# Patient Record
Sex: Male | Born: 1950 | Race: Black or African American | Hispanic: No | Smoking: Current some day smoker
Health system: Southern US, Community
[De-identification: ages and names within clinical notes are randomized; demographics above are authoritative.]

## PROBLEM LIST (undated history)

## (undated) DIAGNOSIS — I10 Essential (primary) hypertension: Secondary | ICD-10-CM

## (undated) DIAGNOSIS — E7800 Pure hypercholesterolemia, unspecified: Secondary | ICD-10-CM

---

## 2013-03-25 ENCOUNTER — Encounter (HOSPITAL_BASED_OUTPATIENT_CLINIC_OR_DEPARTMENT_OTHER): Payer: Self-pay | Admitting: *Deleted

## 2013-03-25 ENCOUNTER — Emergency Department (HOSPITAL_BASED_OUTPATIENT_CLINIC_OR_DEPARTMENT_OTHER)
Admission: EM | Admit: 2013-03-25 | Discharge: 2013-03-25 | Disposition: A | Discharge status: Home / Self Care | Payer: Non-veteran care | Attending: Emergency Medicine | Admitting: Emergency Medicine

## 2013-03-25 ENCOUNTER — Emergency Department (HOSPITAL_BASED_OUTPATIENT_CLINIC_OR_DEPARTMENT_OTHER): Payer: Non-veteran care

## 2013-03-25 DIAGNOSIS — R0789 Other chest pain: Secondary | ICD-10-CM

## 2013-03-25 DIAGNOSIS — Z862 Personal history of diseases of the blood and blood-forming organs and certain disorders involving the immune mechanism: Secondary | ICD-10-CM | POA: Insufficient documentation

## 2013-03-25 DIAGNOSIS — I10 Essential (primary) hypertension: Secondary | ICD-10-CM | POA: Insufficient documentation

## 2013-03-25 DIAGNOSIS — F17200 Nicotine dependence, unspecified, uncomplicated: Secondary | ICD-10-CM | POA: Insufficient documentation

## 2013-03-25 DIAGNOSIS — Z8639 Personal history of other endocrine, nutritional and metabolic disease: Secondary | ICD-10-CM | POA: Insufficient documentation

## 2013-03-25 DIAGNOSIS — R071 Chest pain on breathing: Secondary | ICD-10-CM | POA: Insufficient documentation

## 2013-03-25 HISTORY — DX: Essential (primary) hypertension: I10

## 2013-03-25 HISTORY — DX: Pure hypercholesterolemia, unspecified: E78.00

## 2013-03-25 LAB — CBC WITH DIFFERENTIAL/PLATELET
Hemoglobin: 17 g/dL (ref 13.0–17.0)
Lymphocytes Relative: 36 % (ref 12–46)
Lymphs Abs: 2.9 10*3/uL (ref 0.7–4.0)
MCH: 31 pg (ref 26.0–34.0)
Monocytes Relative: 7 % (ref 3–12)
Neutro Abs: 4.5 10*3/uL (ref 1.7–7.7)
Neutrophils Relative %: 55 % (ref 43–77)
RBC: 5.49 MIL/uL (ref 4.22–5.81)

## 2013-03-25 LAB — TROPONIN I: Troponin I: <0.3 ng/mL (ref <0.30)

## 2013-03-25 LAB — BASIC METABOLIC PANEL
BUN: 13 mg/dL (ref 6–23)
Chloride: 99 mEq/L (ref 96–112)
Glucose, Bld: 108 mg/dL — ABNORMAL HIGH (ref 70–99)
Potassium: 3.7 mEq/L (ref 3.5–5.1)

## 2013-03-25 MED ORDER — ASPIRIN 81 MG PO CHEW
324.0000 mg | CHEWABLE_TABLET | Freq: Once | ORAL | Status: AC
  Administered 2013-03-25: 324 mg via ORAL
  Filled 2013-03-25: qty 4

## 2013-03-25 MED ORDER — HYDROCODONE-ACETAMINOPHEN 5-325 MG PO TABS: 2.0000 | ORAL_TABLET | Freq: Once | ORAL | Status: DC

## 2013-03-25 MED ORDER — HYDROCODONE-ACETAMINOPHEN 5-325 MG PO TABS: 2.0000 | ORAL_TABLET | ORAL | Status: DC | PRN

## 2013-03-25 MED ORDER — IBUPROFEN 800 MG PO TABS: 800.0000 mg | ORAL_TABLET | Freq: Three times a day (TID) | ORAL | Status: DC

## 2013-03-25 NOTE — ED Notes (Signed)
Pt states he "feels much better"- EDPA Sofia updated- VS WNL

## 2013-03-25 NOTE — ED Provider Notes (Signed)
History    CSN: [id number] Arrival date & time 03/25/13  1445  First MD Initiated Contact with Patient 03/25/13 1518     Chief Complaint  Patient presents with  . Chest Pain   (Consider location/radiation/quality/duration/timing/severity/associated sxs/prior Treatment) Patient is a 62 y.o. male presenting with chest pain. The history is provided by the patient. No language interpreter was used.  Chest Pain Pain location:  L chest Pain quality: aching   Pain radiates to:  Does not radiate Pain radiates to the back: no   Pain severity:  Moderate Onset quality:  Unable to specify Timing:  Constant Progression:  Worsening Relieved by:  Nothing Worsened by:  Certain positions and movement Associated symptoms: no abdominal pain   Pt reports he awoke with pain in left side of chest.  Pt reports pain when he moves his arm.  Pt thinks he slept in a weird position and injured left arm and chest.   Past Medical History  Diagnosis Date  . Hypertension   . High cholesterol    History reviewed. No pertinent past surgical history. No family history on file. History  Substance Use Topics  . Smoking status: Current Every Day Smoker -- 0.50 packs/day    Types: Cigarettes  . Smokeless tobacco: Not on file  . Alcohol Use: No    Review of Systems  Cardiovascular: Positive for chest pain.  Gastrointestinal: Negative for abdominal pain.  All other systems reviewed and are negative.    Allergies  Review of patient's allergies indicates no known allergies.  Home Medications  No current outpatient prescriptions on file. BP 128/82  Pulse 88  Temp(Src) 98.7 F (37.1 C) (Oral)  Resp 20  Ht 5\' 11"  (1.803 m)  Wt 250 lb (113.399 kg)  BMI 34.88 kg/m2  SpO2 99% Physical Exam  Nursing note and vitals reviewed. Constitutional: He appears well-developed and well-nourished.  HENT:  Head: Normocephalic and atraumatic.  Eyes: Pupils are equal, round, and reactive to light.  Neck: Normal  range of motion.  Cardiovascular: Normal rate.   Pulmonary/Chest: Effort normal.  Abdominal: Soft.  Musculoskeletal: Normal range of motion.  Neurological: He is alert.  Skin: Skin is warm.    ED Course  Procedures (including critical care time) Labs Reviewed - No data to display No results found. 1. Chest wall pain    Results for orders placed during the hospital encounter of 03/25/13  TROPONIN I      Result Value Range   Troponin I <0.30  <0.30 ng/mL  CBC WITH DIFFERENTIAL      Result Value Range   WBC 8.1  4.0 - 10.5 K/uL   RBC 5.49  4.22 - 5.81 MIL/uL   Hemoglobin 17.0  13.0 - 17.0 g/dL   HCT 41.3  24.4 - 01.0 %   MCV 88.9  78.0 - 100.0 fL   MCH 31.0  26.0 - 34.0 pg   MCHC 34.8  30.0 - 36.0 g/dL   RDW 27.2  53.6 - 64.4 %   Platelets 181  150 - 400 K/uL   Neutrophils Relative % 55  43 - 77 %   Neutro Abs 4.5  1.7 - 7.7 K/uL   Lymphocytes Relative 36  12 - 46 %   Lymphs Abs 2.9  0.7 - 4.0 K/uL   Monocytes Relative 7  3 - 12 %   Monocytes Absolute 0.6  0.1 - 1.0 K/uL   Eosinophils Relative 2  0 - 5 %   Eosinophils Absolute  0.1  0.0 - 0.7 K/uL   Basophils Relative 0  0 - 1 %   Basophils Absolute 0.0  0.0 - 0.1 K/uL  BASIC METABOLIC PANEL      Result Value Range   Sodium 137  135 - 145 mEq/L   Potassium 3.7  3.5 - 5.1 mEq/L   Chloride 99  96 - 112 mEq/L   CO2 25  19 - 32 mEq/L   Glucose, Bld 108 (*) 70 - 99 mg/dL   BUN 13  6 - 23 mg/dL   Creatinine, Ser 4.09  0.50 - 1.35 mg/dL   Calcium 81.1  8.4 - 91.4 mg/dL   GFR calc non Af Amer >90  >90 mL/min   GFR calc Af Amer >90  >90 mL/min  TROPONIN I      Result Value Range   Troponin I <0.30  <0.30 ng/mL   No results found.  MDM   Date: 03/25/2013  Rate: 91  Rhythm: normal sinus rhythm  QRS Axis: left  Intervals: normal  ST/T Wave abnormalities: normal  Conduction Disutrbances:none  Narrative Interpretation:   Old EKG Reviewed: unchanged Monitor normal.   Troponin x2 negative,   Pt thinks discomfort is  muscular.   I advised pt to see his MD for recheck next week.  I doubt cardiac  Elson Areas, PA-C 03/25/13 2221

## 2013-03-25 NOTE — ED Notes (Signed)
Woke this am with left sided chest pain.

## 2013-03-25 NOTE — ED Notes (Signed)
rx x 2 given for ibuprofen and vicodin

## 2013-03-26 NOTE — ED Provider Notes (Signed)
History/physical exam/procedure(s) were performed by non-physician practitioner and as supervising physician I was immediately available for consultation/collaboration. I have reviewed all notes and am in agreement with care and plan.   Arch Methot S Taraann Olthoff, MD 03/26/13 2231 

## 2013-10-02 ENCOUNTER — Emergency Department (HOSPITAL_BASED_OUTPATIENT_CLINIC_OR_DEPARTMENT_OTHER): Payer: Medicare Other

## 2013-10-02 ENCOUNTER — Encounter (HOSPITAL_BASED_OUTPATIENT_CLINIC_OR_DEPARTMENT_OTHER): Payer: Self-pay | Admitting: Emergency Medicine

## 2013-10-02 ENCOUNTER — Emergency Department (HOSPITAL_BASED_OUTPATIENT_CLINIC_OR_DEPARTMENT_OTHER)
Admission: EM | Admit: 2013-10-02 | Discharge: 2013-10-02 | Disposition: A | Discharge status: Home / Self Care | Payer: Medicare Other | Attending: Emergency Medicine | Admitting: Emergency Medicine

## 2013-10-02 DIAGNOSIS — E7800 Pure hypercholesterolemia, unspecified: Secondary | ICD-10-CM | POA: Insufficient documentation

## 2013-10-02 DIAGNOSIS — I10 Essential (primary) hypertension: Secondary | ICD-10-CM | POA: Insufficient documentation

## 2013-10-02 DIAGNOSIS — J069 Acute upper respiratory infection, unspecified: Secondary | ICD-10-CM | POA: Insufficient documentation

## 2013-10-02 DIAGNOSIS — R079 Chest pain, unspecified: Secondary | ICD-10-CM

## 2013-10-02 DIAGNOSIS — F17200 Nicotine dependence, unspecified, uncomplicated: Secondary | ICD-10-CM | POA: Insufficient documentation

## 2013-10-02 LAB — CBC WITH DIFFERENTIAL/PLATELET
BASOS PCT: 0 % (ref 0–1)
Basophils Absolute: 0 10*3/uL (ref 0.0–0.1)
EOS ABS: 0.4 10*3/uL (ref 0.0–0.7)
EOS PCT: 5 % (ref 0–5)
HCT: 50.4 % (ref 39.0–52.0)
Hemoglobin: 17.1 g/dL — ABNORMAL HIGH (ref 13.0–17.0)
LYMPHS ABS: 3.1 10*3/uL (ref 0.7–4.0)
Lymphocytes Relative: 39 % (ref 12–46)
MCH: 30.5 pg (ref 26.0–34.0)
MCHC: 33.9 g/dL (ref 30.0–36.0)
MCV: 90 fL (ref 78.0–100.0)
Monocytes Absolute: 0.7 10*3/uL (ref 0.1–1.0)
Monocytes Relative: 9 % (ref 3–12)
Neutro Abs: 3.7 10*3/uL (ref 1.7–7.7)
Neutrophils Relative %: 46 % (ref 43–77)
PLATELETS: 177 10*3/uL (ref 150–400)
RBC: 5.6 MIL/uL (ref 4.22–5.81)
RDW: 14.6 % (ref 11.5–15.5)
WBC: 7.9 10*3/uL (ref 4.0–10.5)

## 2013-10-02 LAB — BASIC METABOLIC PANEL
BUN: 13 mg/dL (ref 6–23)
CALCIUM: 9.5 mg/dL (ref 8.4–10.5)
CO2: 24 mEq/L (ref 19–32)
Chloride: 99 mEq/L (ref 96–112)
Creatinine, Ser: 0.8 mg/dL (ref 0.50–1.35)
GLUCOSE: 126 mg/dL — AB (ref 70–99)
Potassium: 3.9 mEq/L (ref 3.7–5.3)
SODIUM: 139 meq/L (ref 137–147)

## 2013-10-02 LAB — TROPONIN I: Troponin I: <0.3 ng/mL (ref <0.30)

## 2013-10-02 LAB — D-DIMER, QUANTITATIVE: D-Dimer, Quant: <0.27 ug/mL-FEU (ref 0.00–0.48)

## 2013-10-02 MED ORDER — KETOROLAC TROMETHAMINE 30 MG/ML IJ SOLN: 30.0000 mg | Freq: Once | INTRAMUSCULAR | Status: DC

## 2013-10-02 MED ORDER — ASPIRIN 81 MG PO CHEW
324.0000 mg | CHEWABLE_TABLET | Freq: Once | ORAL | Status: AC
  Administered 2013-10-02: 324 mg via ORAL
  Filled 2013-10-02: qty 4

## 2013-10-02 MED ORDER — KETOROLAC TROMETHAMINE 30 MG/ML IJ SOLN
30.0000 mg | Freq: Once | INTRAMUSCULAR | Status: AC
  Administered 2013-10-02: 30 mg via INTRAVENOUS
  Filled 2013-10-02: qty 1

## 2013-10-02 NOTE — Discharge Instructions (Signed)
Chest Pain (Nonspecific) °It is often hard to give a specific diagnosis for the cause of chest pain. There is always a chance that your pain could be related to something serious, such as a heart attack or a blood clot in the lungs. You need to follow up with your caregiver for further evaluation. °CAUSES  °· Heartburn. °· Pneumonia or bronchitis. °· Anxiety or stress. °· Inflammation around your heart (pericarditis) or lung (pleuritis or pleurisy). °· A blood clot in the lung. °· A collapsed lung (pneumothorax). It can develop suddenly on its own (spontaneous pneumothorax) or from injury (trauma) to the chest. °· Shingles infection (herpes zoster virus). °The chest wall is composed of bones, muscles, and cartilage. Any of these can be the source of the pain. °· The bones can be bruised by injury. °· The muscles or cartilage can be strained by coughing or overwork. °· The cartilage can be affected by inflammation and become sore (costochondritis). °DIAGNOSIS  °Lab tests or other studies, such as X-rays, electrocardiography, stress testing, or cardiac imaging, may be needed to find the cause of your pain.  °TREATMENT  °· Treatment depends on what may be causing your chest pain. Treatment may include: °· Acid blockers for heartburn. °· Anti-inflammatory medicine. °· Pain medicine for inflammatory conditions. °· Antibiotics if an infection is present. °· You may be advised to change lifestyle habits. This includes stopping smoking and avoiding alcohol, caffeine, and chocolate. °· You may be advised to keep your head raised (elevated) when sleeping. This reduces the chance of acid going backward from your stomach into your esophagus. °· Most of the time, nonspecific chest pain will improve within 2 to 3 days with rest and mild pain medicine. °HOME CARE INSTRUCTIONS  °· If antibiotics were prescribed, take your antibiotics as directed. Finish them even if you start to feel better. °· For the next few days, avoid physical  activities that bring on chest pain. Continue physical activities as directed. °· Do not smoke. °· Avoid drinking alcohol. °· Only take over-the-counter or prescription medicine for pain, discomfort, or fever as directed by your caregiver. °· Follow your caregiver's suggestions for further testing if your chest pain does not go away. °· Keep any follow-up appointments you made. If you do not go to an appointment, you could develop lasting (chronic) problems with pain. If there is any problem keeping an appointment, you must call to reschedule. °SEEK MEDICAL CARE IF:  °· You think you are having problems from the medicine you are taking. Read your medicine instructions carefully. °· Your chest pain does not go away, even after treatment. °· You develop a rash with blisters on your chest. °SEEK IMMEDIATE MEDICAL CARE IF:  °· You have increased chest pain or pain that spreads to your arm, neck, jaw, back, or abdomen. °· You develop shortness of breath, an increasing cough, or you are coughing up blood. °· You have severe back or abdominal pain, feel nauseous, or vomit. °· You develop severe weakness, fainting, or chills. °· You have a fever. °THIS IS AN EMERGENCY. Do not wait to see if the pain will go away. Get medical help at once. Call your local emergency services (911 in [place] Do not drive yourself to the hospital. °MAKE SURE YOU:  °· Understand these instructions. °· Will watch your condition. °· Will get help right away if you are not doing well or get worse. °Document Released: 06/19/2005 Document Revised: 12/02/2011 Document Reviewed: 04/14/2008 °ExitCare® Patient Information ©2014 [employer],   LLC. Upper Respiratory Infection, Adult An upper respiratory infection (URI) is also known as the common cold. It is often caused by a type of germ (virus). Colds are easily spread (contagious). You can pass it to others by kissing, coughing, sneezing, or drinking out of the same glass. Usually, you get better in 1 or 2  weeks.  HOME CARE   Only take medicine as told by your doctor.  Use a warm mist humidifier or breathe in steam from a hot shower.  Drink enough water and fluids to keep your pee (urine) clear or pale yellow.  Get plenty of rest.  Return to work when your temperature is back to normal or as told by your doctor. You may use a face mask and wash your hands to stop your cold from spreading. GET HELP RIGHT AWAY IF:   After the first few days, you feel you are getting worse.  You have questions about your medicine.  You have chills, shortness of breath, or brown or red spit (mucus).  You have yellow or brown snot (nasal discharge) or pain in the face, especially when you bend forward.  You have a fever, puffy (swollen) neck, pain when you swallow, or white spots in the back of your throat.  You have a bad headache, ear pain, sinus pain, or chest pain.  You have a high-pitched whistling sound when you breathe in and out (wheezing).  You have a lasting cough or cough up blood.  You have sore muscles or a stiff neck. MAKE SURE YOU:   Understand these instructions.  Will watch your condition.  Will get help right away if you are not doing well or get worse. Document Released: 02/26/2008 Document Revised: 12/02/2011 Document Reviewed: 01/14/2011 [facility] Inc[facility] Patient Information 2014 [place]ExitCare, [place]LL[place].

## 2013-10-02 NOTE — ED Provider Notes (Signed)
CSN: [id number]     Arrival date & time 10/02/13  1457 History  This chart was scribed for Wayne Batonourtney F Dametri Ozburn, MD by Leone PayorSonum Patel, ED Scribe. This patient was seen in room MH07/MH07 and the patient's care was started 4:38 PM.    Chief Complaint  Patient presents with  . Chest Pain    The history is provided by the patient. No language interpreter was used.    HPI Comments: Wayne York is a 63 y.o. male with past medical history of HTN, high cholesterol who presents to the Emergency Department complaining of 2 days of constant chest pain that he describes as pressure. He reports having associated SOB which is worse with deep breathing. He denies similar symptoms in the past. Pt states he has been helping his wife who is recovering from surgery and thinks he may have pulled a muscle. He currently rates his pain as 7/10, 8/10 at max today. He is not taking any OTC medications to treat this pain. He denies leg swelling, fever, cough. He is a current smoker. His brother passed away of an MI at age 61-45.   Past Medical History  Diagnosis Date  . Hypertension   . High cholesterol    History reviewed. No pertinent past surgical history. No family history on file. History  Substance Use Topics  . Smoking status: Current Every Day Smoker -- 0.50 packs/day    Types: Cigarettes  . Smokeless tobacco: Not on file  . Alcohol Use: No    Review of Systems  Constitutional: Negative.  Negative for fever.  Respiratory: Positive for cough and chest tightness. Negative for shortness of breath.   Cardiovascular: Negative.  Negative for chest pain.  Gastrointestinal: Negative.  Negative for abdominal pain.  Genitourinary: Negative.  Negative for dysuria.  Musculoskeletal: Negative for back pain.  Skin: Negative for rash.  Neurological: Negative for headaches.  All other systems reviewed and are negative.    Allergies  Review of patient's allergies indicates no known allergies.  Home Medications   No current outpatient prescriptions on file. BP 102/71  Pulse 86  Temp(Src) 99.5 F (37.5 C) (Oral)  Resp 18  SpO2 95% Physical Exam  Nursing note and vitals reviewed. Constitutional: He is oriented to person, place, and time. He appears well-developed and well-nourished. No distress.  HENT:  Head: Normocephalic and atraumatic.  Mouth/Throat: Oropharynx is clear and moist.  Eyes: Pupils are equal, round, and reactive to light.  Neck: Neck supple.  Cardiovascular: Normal rate, regular rhythm and normal heart sounds.   No murmur heard. Pulmonary/Chest: Effort normal and breath sounds normal. No respiratory distress. He has no wheezes. He exhibits tenderness.  Abdominal: Soft. Bowel sounds are normal. There is no tenderness. There is no rebound.  Musculoskeletal: He exhibits no edema.  Lymphadenopathy:    He has no cervical adenopathy.  Neurological: He is alert and oriented to person, place, and time.  Skin: Skin is warm and dry.  Psychiatric: He has a normal mood and affect.    ED Course  Procedures (including critical care time)  DIAGNOSTIC STUDIES: Oxygen Saturation is 98% on RA, normal by my interpretation.    COORDINATION OF CARE: 4:38 PM Discussed treatment plan with pt at bedside and pt agreed to plan.   Labs Review Labs Reviewed  CBC WITH DIFFERENTIAL - Abnormal; Notable for the following:    Hemoglobin 17.1 (*)    All other components within normal limits  BASIC METABOLIC PANEL - Abnormal; Notable for the  following:    Glucose, Bld 126 (*)    All other components within normal limits  TROPONIN I  D-DIMER, QUANTITATIVE  TROPONIN I   Imaging Review Dg Chest 2 View  10/02/2013   CLINICAL DATA:  Left chest pain  EXAM: CHEST  2 VIEW  COMPARISON:  03/26/2003.  FINDINGS: Stable cardiomegaly without CHF or pneumonia. Tortuous ectatic thoracic aorta as before. Trachea is midline. No focal pneumonia. Negative for edema, effusion or pneumothorax.  IMPRESSION:  Cardiomegaly without acute process.  Tortuous ectatic aorta.   Electronically Signed   By: Ruel Favors M.D.   On: 10/02/2013 15:46    EKG Interpretation    Date/Time:  Saturday October 02 2013 15:12:14 EST Ventricular Rate:  88 PR Interval:  174 QRS Duration: 108 QT Interval:  346 QTC Calculation: 418 R Axis:   -87 Text Interpretation:  Normal sinus rhythm Left axis deviation Abnormal ECG No significant change since last tracing Confirmed by Bon Dowis  MD, Rasool Rommel ([id number]) on 10/02/2013 6:23:39 PM            MDM   1. Chest pain   2. URI (upper respiratory infection)    The patient presents with chest pain. Onset of symptoms was 2-3 days ago. It is worse with movement and coughing. Patient is nontoxic-appearing on exam and physical exam is benign. Risk factors for heart disease include hypertension and early family history as well as hypercholesterolemia. EKG is unchanged from prior and initial troponin is negative. Patient does have some reproducibility on pain and reports possible musculoskeletal aggravation. Patient was given aspirin and Toradol. He reports improvement of pain. Patient also reports URI symptoms including cough. Suspect patient's pain may be secondary to musculoskeletal etiology versus early URI. Delta troponin is negative. Patient does have risk factors for heart disease but can be followed up as an outpatient. He was given cardiology followup and encouraged to call for stress testing in the next 2-3 days.  After history, exam, and medical workup I feel the patient has been appropriately medically screened and is safe for discharge home. Pertinent diagnoses were discussed with the patient. Patient was given return precautions.   I personally performed the services described in this documentation, which was scribed in my presence. The recorded information has been reviewed and is accurate.    Wayne Baton, MD 10/02/13 [phone]

## 2013-10-02 NOTE — ED Notes (Signed)
Patient here with SSCP x 2 days, pain worse with inspiration and movement. Has been helping his wife who is recovering from surgery and thinks he may have pulled something

## 2014-02-07 ENCOUNTER — Emergency Department (HOSPITAL_BASED_OUTPATIENT_CLINIC_OR_DEPARTMENT_OTHER)
Admission: EM | Admit: 2014-02-07 | Discharge: 2014-02-07 | Disposition: A | Discharge status: Home / Self Care | Payer: Non-veteran care | Attending: Emergency Medicine | Admitting: Emergency Medicine

## 2014-02-07 ENCOUNTER — Encounter (HOSPITAL_BASED_OUTPATIENT_CLINIC_OR_DEPARTMENT_OTHER): Payer: Self-pay | Admitting: Emergency Medicine

## 2014-02-07 DIAGNOSIS — Z8639 Personal history of other endocrine, nutritional and metabolic disease: Secondary | ICD-10-CM | POA: Insufficient documentation

## 2014-02-07 DIAGNOSIS — Z862 Personal history of diseases of the blood and blood-forming organs and certain disorders involving the immune mechanism: Secondary | ICD-10-CM | POA: Insufficient documentation

## 2014-02-07 DIAGNOSIS — W57XXXA Bitten or stung by nonvenomous insect and other nonvenomous arthropods, initial encounter: Principal | ICD-10-CM

## 2014-02-07 DIAGNOSIS — T63301A Toxic effect of unspecified spider venom, accidental (unintentional), initial encounter: Secondary | ICD-10-CM

## 2014-02-07 DIAGNOSIS — S90569A Insect bite (nonvenomous), unspecified ankle, initial encounter: Secondary | ICD-10-CM | POA: Insufficient documentation

## 2014-02-07 DIAGNOSIS — Y929 Unspecified place or not applicable: Secondary | ICD-10-CM | POA: Insufficient documentation

## 2014-02-07 DIAGNOSIS — Y9389 Activity, other specified: Secondary | ICD-10-CM | POA: Insufficient documentation

## 2014-02-07 DIAGNOSIS — I10 Essential (primary) hypertension: Secondary | ICD-10-CM | POA: Insufficient documentation

## 2014-02-07 DIAGNOSIS — F17200 Nicotine dependence, unspecified, uncomplicated: Secondary | ICD-10-CM | POA: Insufficient documentation

## 2014-02-07 NOTE — Discharge Instructions (Signed)
Spider Bite Spider bites are not common. Most spider bites do not cause serious problems. The elderly, very young children, and people with certain existing medical conditions are more likely to experience significant symptoms. SYMPTOMS  Spider bites may not cause any pain at first. Within 1 or 2 days of the bite, there may be swelling, redness, and pain in the bite area. However, some spider bites can cause pain within the first hour. TREATMENT  Your caregiver may prescribe antibiotic medicine if a bacterial infection develops in the bite. However, not all spider bites require antibiotics or prescription medicines.  HOME CARE INSTRUCTIONS  Do not scratch the bite area.  Keep the bite area clean and dry. Wash the area with soap and water as directed.  Put ice or cool compresses on the bite area.  Put ice in a plastic bag.  Place a towel between your skin and the bag.  Leave the ice on for 20 minutes, 4 times a day for the first 2 to 3 days, or as directed.  Keep the bite area elevated above the level of your heart. This helps reduce redness and swelling.  Only take over-the-counter or prescription medicines as directed by your caregiver.  If you are given antibiotics, take them as directed. Finish them even if you start to feel better. You may need a tetanus shot if:  You cannot remember when you had your last tetanus shot.  You have never had a tetanus shot.  The injury broke your skin. If you get a tetanus shot, your arm may swell, get red, and feel warm to the touch. This is common and not a problem. If you need a tetanus shot and you choose not to have one, there is a rare chance of getting tetanus. Sickness from tetanus can be serious. SEEK MEDICAL CARE IF: Your bite is not better after 3 days of treatment. SEEK IMMEDIATE MEDICAL CARE IF:  Your bite turns purple or develops increased swelling, pain, or redness.  You develop shortness of breath or chest pain.  You have  muscle cramps or painful muscle spasms.  You develop abdominal pain, nausea, or vomiting.  You feel unusually tired or sleepy. MAKE SURE YOU:  Understand these instructions.  Will watch your condition.  Will get help right away if you are not doing well or get worse. Document Released: 10/17/2004 Document Revised: 12/02/2011 Document Reviewed: 04/10/2011 [employer] Patient Information 2014 [employer].  

## 2014-02-07 NOTE — ED Notes (Signed)
Pt felt something bite back of left calf on Saturday-reddened area with slight pain at presnt

## 2014-02-07 NOTE — ED Provider Notes (Signed)
CSN: [id number]     Arrival date & time 02/07/14  1237 History   First MD Initiated Contact with Patient 02/07/14 1247     No chief complaint on file.    (Consider location/radiation/quality/duration/timing/severity/associated sxs/prior Treatment) HPI Pt reports he was sitting on a chair on his porch 2 days ago when he felt something bite his L leg. Found several spiders under the chair, although he did not see what had actually bitten him. He has had a small red area there since then, mild itching, no pain or drainage. Minimal swelling.   Past Medical History  Diagnosis Date  . Hypertension   . High cholesterol    No past surgical history on file. No family history on file. History  Substance Use Topics  . Smoking status: Current Every Day Smoker -- 0.50 packs/day    Types: Cigarettes  . Smokeless tobacco: Not on file  . Alcohol Use: No    Review of Systems All other systems reviewed and are negative except as noted in HPI.     Allergies  Review of patient's allergies indicates no known allergies.  Home Medications   Prior to Admission medications   Not on File   BP 139/85  Pulse 67  Temp(Src) 99.8 F (37.7 C) (Oral)  Resp 16  Ht 5\' 10"  (1.778 m)  Wt 263 lb (119.296 kg)  BMI 37.74 kg/m2  SpO2 97% Physical Exam  Constitutional: He is oriented to person, place, and time. He appears well-developed and well-nourished.  HENT:  Head: Normocephalic and atraumatic.  Neck: Neck supple.  Pulmonary/Chest: Effort normal.  Neurological: He is alert and oriented to person, place, and time. No cranial nerve deficit.  Skin:  1cm x 1cm area of erythema, mild induration L medial calf, no fluctuance or drainage. No signs of surrounding cellulitis  Psychiatric: He has a normal mood and affect. His behavior is normal.    ED Course  Procedures (including critical care time) Labs Review Labs Reviewed - No data to display  Imaging Review No results found.   EKG  Interpretation None      MDM   Final diagnoses:  Spider bite   Likely insect vs spider bite, no signs of secondary infection or ulceration. Advised topical treatments and observation. Advised to return for any worsening pain, swelling, drainage, streaking or for any other concerns.     Delorus Langwell B. Bernette MayersSheldon, MD 02/07/14 1257

## 2014-09-19 ENCOUNTER — Emergency Department (HOSPITAL_BASED_OUTPATIENT_CLINIC_OR_DEPARTMENT_OTHER): Payer: Non-veteran care

## 2014-09-19 ENCOUNTER — Encounter (HOSPITAL_BASED_OUTPATIENT_CLINIC_OR_DEPARTMENT_OTHER): Payer: Self-pay

## 2014-09-19 ENCOUNTER — Emergency Department (HOSPITAL_BASED_OUTPATIENT_CLINIC_OR_DEPARTMENT_OTHER)
Admission: EM | Admit: 2014-09-19 | Discharge: 2014-09-19 | Disposition: A | Discharge status: Home / Self Care | Payer: Non-veteran care | Attending: Emergency Medicine | Admitting: Emergency Medicine

## 2014-09-19 DIAGNOSIS — I10 Essential (primary) hypertension: Secondary | ICD-10-CM | POA: Diagnosis not present

## 2014-09-19 DIAGNOSIS — Z8639 Personal history of other endocrine, nutritional and metabolic disease: Secondary | ICD-10-CM | POA: Diagnosis not present

## 2014-09-19 DIAGNOSIS — M25562 Pain in left knee: Secondary | ICD-10-CM | POA: Insufficient documentation

## 2014-09-19 DIAGNOSIS — Z87891 Personal history of nicotine dependence: Secondary | ICD-10-CM | POA: Diagnosis not present

## 2014-09-19 DIAGNOSIS — R52 Pain, unspecified: Secondary | ICD-10-CM

## 2014-09-19 MED ORDER — HYDROCODONE-ACETAMINOPHEN 5-325 MG PO TABS: 1.0000 | ORAL_TABLET | ORAL | Status: DC | PRN

## 2014-09-19 NOTE — ED Notes (Signed)
Pt reports left knee pain x 7 days.  Reports pain with weight bearing.

## 2014-09-19 NOTE — Discharge Instructions (Signed)

## 2014-09-19 NOTE — ED Provider Notes (Signed)
CSN: [id number]     Arrival date & time 09/19/14  1327 History   First MD Initiated Contact with Patient 09/19/14 1544     Chief Complaint  Patient presents with  . Knee Pain     (Consider location/radiation/quality/duration/timing/severity/associated sxs/prior Treatment) HPI Comments: Pt c/o left knee pain times 7 days. Denies redness, swelling or warmth to the area. Denies previous injury. States that he has pain with ambulation. He states that he fell out of bed a couple of days ago but doesn't remember that being the cause of his pain. He has been wearing a brace  The history is provided by the patient. No language interpreter was used.    Past Medical History  Diagnosis Date  . Hypertension   . High cholesterol    History reviewed. No pertinent past surgical history. No family history on file. History  Substance Use Topics  . Smoking status: Former Smoker -- 0.50 packs/day    Types: Cigarettes    Quit date: 09/05/2014  . Smokeless tobacco: Not on file  . Alcohol Use: Yes    Review of Systems  All other systems reviewed and are negative.     Allergies  Review of patient's allergies indicates no known allergies.  Home Medications   Prior to Admission medications   Medication Sig Start Date End Date Taking? Authorizing Provider  HYDROcodone-acetaminophen (NORCO/VICODIN) 5-325 MG per tablet Take 1-2 tablets by mouth every 4 (four) hours as needed. 09/19/14   Teressa LowerVrinda Laren Whaling, NP  UNKNOWN TO PATIENT meds for BP, cholesterol    Historical Provider, MD   BP 113/76 mmHg  Pulse 94  Temp(Src) 98.5 F (36.9 C) (Oral)  Resp 18  Ht 5\' 10"  (1.778 m)  Wt 264 lb (119.75 kg)  BMI 37.88 kg/m2  SpO2 97% Physical Exam  Constitutional: He is oriented to person, place, and time.  Cardiovascular: Normal rate and regular rhythm.   Pulmonary/Chest: Effort normal and breath sounds normal.  Musculoskeletal: Normal range of motion.  Knee without swelling redness or warmth. Full  rom . Pulses intact  Neurological: He is alert and oriented to person, place, and time. Coordination normal.  Skin: Skin is warm and dry.  Psychiatric: He has a normal mood and affect.  Nursing note and vitals reviewed.   ED Course  Procedures (including critical care time) Labs Review Labs Reviewed - No data to display  Imaging Review Dg Knee 2 Views Left  09/19/2014   CLINICAL DATA:  Pain medially for 1 week  EXAM: LEFT KNEE - 1-2 VIEW  COMPARISON:  April 15, 2013  FINDINGS: Frontal and lateral views were obtained. No fracture, dislocation, or effusion. Joint spaces appear intact. No erosive change. There is a small amount of calcification in the popliteal artery.  IMPRESSION: No fracture or effusion. No appreciable arthropathy. Atherosclerotic change in popliteal artery.   Electronically Signed   By: Bretta BangWilliam  Woodruff M.D.   On: 09/19/2014 14:04     EKG Interpretation None      MDM   Final diagnoses:  Left knee pain    No acute finding on xray. Will treat with hydrocodone for pain. Pt given referral to Dr. Vivi Barrackhudnall   Jeannett Dekoning, NP 09/19/14 1600  Purvis SheffieldForrest Harrison, MD 09/20/14 1007

## 2014-11-24 ENCOUNTER — Emergency Department (HOSPITAL_BASED_OUTPATIENT_CLINIC_OR_DEPARTMENT_OTHER): Payer: Non-veteran care

## 2014-11-24 ENCOUNTER — Emergency Department (HOSPITAL_BASED_OUTPATIENT_CLINIC_OR_DEPARTMENT_OTHER)
Admission: EM | Admit: 2014-11-24 | Discharge: 2014-11-24 | Disposition: A | Discharge status: Home / Self Care | Payer: Non-veteran care | Attending: Emergency Medicine | Admitting: Emergency Medicine

## 2014-11-24 ENCOUNTER — Encounter (HOSPITAL_BASED_OUTPATIENT_CLINIC_OR_DEPARTMENT_OTHER): Payer: Self-pay

## 2014-11-24 DIAGNOSIS — Z8639 Personal history of other endocrine, nutritional and metabolic disease: Secondary | ICD-10-CM | POA: Insufficient documentation

## 2014-11-24 DIAGNOSIS — J069 Acute upper respiratory infection, unspecified: Secondary | ICD-10-CM | POA: Diagnosis not present

## 2014-11-24 DIAGNOSIS — I10 Essential (primary) hypertension: Secondary | ICD-10-CM | POA: Insufficient documentation

## 2014-11-24 DIAGNOSIS — R05 Cough: Secondary | ICD-10-CM | POA: Diagnosis present

## 2014-11-24 DIAGNOSIS — Z87891 Personal history of nicotine dependence: Secondary | ICD-10-CM | POA: Diagnosis not present

## 2014-11-24 DIAGNOSIS — R059 Cough, unspecified: Secondary | ICD-10-CM

## 2014-11-24 MED ORDER — GUAIFENESIN-CODEINE 100-10 MG/5ML PO SOLN: 5.0000 mL | Freq: Three times a day (TID) | ORAL | Status: DC | PRN

## 2014-11-24 NOTE — Discharge Instructions (Signed)
Cough, Adult ° A cough is a reflex that helps clear your throat and airways. It can help heal the body or may be a reaction to an irritated airway. A cough may only last 2 or 3 weeks (acute) or may last more than 8 weeks (chronic).  °CAUSES °Acute cough: °· Viral or bacterial infections. °Chronic cough: °· Infections. °· Allergies. °· Asthma. °· Post-nasal drip. °· Smoking. °· Heartburn or acid reflux. °· Some medicines. °· Chronic lung problems (COPD). °· Cancer. °SYMPTOMS  °· Cough. °· Fever. °· Chest pain. °· Increased breathing rate. °· High-pitched whistling sound when breathing (wheezing). °· Colored mucus that you cough up (sputum). °TREATMENT  °· A bacterial cough may be treated with antibiotic medicine. °· A viral cough must run its course and will not respond to antibiotics. °· Your caregiver may recommend other treatments if you have a chronic cough. °HOME CARE INSTRUCTIONS  °· Only take over-the-counter or prescription medicines for pain, discomfort, or fever as directed by your caregiver. Use cough suppressants only as directed by your caregiver. °· Use a cold steam vaporizer or humidifier in your bedroom or home to help loosen secretions. °· Sleep in a semi-upright position if your cough is worse at night. °· Rest as needed. °· Stop smoking if you smoke. °SEEK IMMEDIATE MEDICAL CARE IF:  °· You have pus in your sputum. °· Your cough starts to worsen. °· You cannot control your cough with suppressants and are losing sleep. °· You begin coughing up blood. °· You have difficulty breathing. °· You develop pain which is getting worse or is uncontrolled with medicine. °· You have a fever. °MAKE SURE YOU:  °· Understand these instructions. °· Will watch your condition. °· Will get help right away if you are not doing well or get worse. °Document Released: 03/08/2011 Document Revised: 12/02/2011 Document Reviewed: 03/08/2011 °Exit[employer]® Patient Information ©2015 [employer]. This information is not intended  to replace advice given to you by your health care provider. Make sure you discuss any questions you have with your health care provider. °Upper Respiratory Infection, Adult °An upper respiratory infection (URI) is also sometimes known as the common cold. The upper respiratory tract includes the nose, sinuses, throat, trachea, and bronchi. Bronchi are the airways leading to the lungs. Most people improve within 1 week, but symptoms can last up to 2 weeks. A residual cough may last even longer.  °CAUSES °Many different viruses can infect the tissues lining the upper respiratory tract. The tissues become irritated and inflamed and often become very moist. Mucus production is also common. A cold is contagious. You can easily spread the virus to others by oral contact. This includes kissing, sharing a glass, coughing, or sneezing. Touching your mouth or nose and then touching a surface, which is then touched by another person, can also spread the virus. °SYMPTOMS  °Symptoms typically develop 1 to 3 days after you come in contact with a cold virus. Symptoms vary from person to person. They may include: °· Runny nose. °· Sneezing. °· Nasal congestion. °· Sinus irritation. °· Sore throat. °· Loss of voice (laryngitis). °· Cough. °· Fatigue. °· Muscle aches. °· Loss of appetite. °· Headache. °· Low-grade fever. °DIAGNOSIS  °You might diagnose your own cold based on familiar symptoms, since most people get a cold 2 to 3 times a year. Your caregiver can confirm this based on your exam. Most importantly, your caregiver can check that your symptoms are not due to another disease such   as strep throat, sinusitis, pneumonia, asthma, or epiglottitis. Blood tests, throat tests, and X-rays are not necessary to diagnose a common cold, but they may sometimes be helpful in excluding other more serious diseases. Your caregiver will decide if any further tests are required. °RISKS AND COMPLICATIONS  °You may be at risk for a more severe  case of the common cold if you smoke cigarettes, have chronic heart disease (such as heart failure) or lung disease (such as asthma), or if you have a weakened immune system. The very young and very old are also at risk for more serious infections. Bacterial sinusitis, middle ear infections, and bacterial pneumonia can complicate the common cold. The common cold can worsen asthma and chronic obstructive pulmonary disease (COPD). Sometimes, these complications can require emergency medical care and may be life-threatening. °PREVENTION  °The best way to protect against getting a cold is to practice good hygiene. Avoid oral or hand contact with people with cold symptoms. Wash your hands often if contact occurs. There is no clear evidence that vitamin C, vitamin E, echinacea, or exercise reduces the chance of developing a cold. However, it is always recommended to get plenty of rest and practice good nutrition. °TREATMENT  °Treatment is directed at relieving symptoms. There is no cure. Antibiotics are not effective, because the infection is caused by a virus, not by bacteria. Treatment may include: °· Increased fluid intake. Sports drinks offer valuable electrolytes, sugars, and fluids. °· Breathing heated mist or steam (vaporizer or shower). °· Eating chicken soup or other clear broths, and maintaining good nutrition. °· Getting plenty of rest. °· Using gargles or lozenges for comfort. °· Controlling fevers with ibuprofen or acetaminophen as directed by your caregiver. °· Increasing usage of your inhaler if you have asthma. °Zinc gel and zinc lozenges, taken in the first 24 hours of the common cold, can shorten the duration and lessen the severity of symptoms. Pain medicines may help with fever, muscle aches, and throat pain. A variety of non-prescription medicines are available to treat congestion and runny nose. Your caregiver can make recommendations and may suggest nasal or lung inhalers for other symptoms.  °HOME  CARE INSTRUCTIONS  °· Only take over-the-counter or prescription medicines for pain, discomfort, or fever as directed by your caregiver. °· Use a warm mist humidifier or inhale steam from a shower to increase air moisture. This may keep secretions moist and make it easier to breathe. °· Drink enough water and fluids to keep your urine clear or pale yellow. °· Rest as needed. °· Return to work when your temperature has returned to normal or as your caregiver advises. You may need to stay home longer to avoid infecting others. You can also use a face mask and careful hand washing to prevent spread of the virus. °SEEK MEDICAL CARE IF:  °· After the first few days, you feel you are getting worse rather than better. °· You need your caregiver's advice about medicines to control symptoms. °· You develop chills, worsening shortness of breath, or brown or red sputum. These may be signs of pneumonia. °· You develop yellow or brown nasal discharge or pain in the face, especially when you bend forward. These may be signs of sinusitis. °· You develop a fever, swollen neck glands, pain with swallowing, or white areas in the back of your throat. These may be signs of strep throat. °SEEK IMMEDIATE MEDICAL CARE IF:  °· You have a fever. °· You develop severe or persistent headache, ear   pain, sinus pain, or chest pain. °· You develop wheezing, a prolonged cough, cough up blood, or have a change in your usual mucus (if you have chronic lung disease). °· You develop sore muscles or a stiff neck. °Document Released: 03/05/2001 Document Revised: 12/02/2011 Document Reviewed: 12/15/2013 °ExitCare® Patient Information ©2015 [employer]. This information is not intended to replace advice given to you by your health care provider. Make sure you discuss any questions you have with your health care provider. ° °

## 2014-11-24 NOTE — ED Notes (Signed)
Pt c/o productive cough with green sputum since Sunday.  Pt states cough "starts in my throat".

## 2014-11-24 NOTE — ED Provider Notes (Signed)
CSN: [id number]     Arrival date & time 11/24/14  1740 History  This chart was scribed for Audree CamelScott T Taytem Ghattas, MD by Luisa DagoPriscilla Tutu, ED Scribe. This patient was seen in room MH10/MH10 and the patient's care was started at 6:16 PM.    Chief Complaint  Patient presents with  . Cough   The history is provided by the patient and medical records. No language interpreter was used.   HPI Comments: Wayne CoombesWillie York is a 64 y.o. male with PMhx of Bronchitis and HTN presents to the Emergency Department complaining of gradual onset nonproductive cough that started 4 days ago but was exacerbated today. Pt states that he [place] in [place] to get an appointment but he advised him to come to the ED to be evaluated, since his did have an open slot for him. He states that the cough is worse at night and is causing him to feel SOB. Pt reports taking antibiotics at home that were filled from a previous illness and doing nebulizer treatments at home without adequate relief of his symptoms.  Pt denies fever,sore throat, rhinorrhea, or nasal congestion.  Denies current dyspnea.  Past Medical History  Diagnosis Date  . Hypertension   . High cholesterol    History reviewed. No pertinent past surgical history. No family history on file. History  Substance Use Topics  . Smoking status: Former Smoker -- 0.50 packs/day    Types: Cigarettes    Quit date: 09/05/2014  . Smokeless tobacco: Not on file  . Alcohol Use: Yes    Review of Systems  Constitutional: Negative for fever and chills.  HENT: Negative for congestion, rhinorrhea and sore throat.   Respiratory: Positive for cough and shortness of breath. Negative for wheezing.   Cardiovascular: Negative for chest pain.  All other systems reviewed and are negative.  Allergies  Review of patient's allergies indicates no known allergies.  Home Medications   Prior to Admission medications   Medication Sig Start Date End Date Taking? Authorizing Provider   HYDROcodone-acetaminophen (NORCO/VICODIN) 5-325 MG per tablet Take 1-2 tablets by mouth every 4 (four) hours as needed. 09/19/14   Teressa LowerVrinda Pickering, NP  UNKNOWN TO PATIENT meds for BP, cholesterol    Historical Provider, MD   BP 129/87 mmHg  Pulse 108  Temp(Src) 99 F (37.2 C) (Oral)  Resp 16  Ht 5\' 8"  (1.727 m)  SpO2 95%  Physical Exam  Constitutional: He is oriented to person, place, and time. He appears well-developed and well-nourished. No distress.  HENT:  Head: Normocephalic and atraumatic.  Right Ear: External ear normal.  Left Ear: External ear normal.  Nose: Nose normal.  Mouth/Throat: Oropharynx is clear and moist. No oropharyngeal exudate.  Eyes: Right eye exhibits no discharge. Left eye exhibits no discharge.  Neck: Neck supple.  Cardiovascular: Normal rate, regular rhythm and normal heart sounds.   Pulmonary/Chest: Effort normal and breath sounds normal. No respiratory distress. He has no wheezes.  Abdominal: He exhibits no distension. There is no tenderness.  Neurological: He is alert and oriented to person, place, and time.  Skin: Skin is warm and dry.  Psychiatric: He has a normal mood and affect. His behavior is normal.  Nursing note and vitals reviewed.   ED Course  Procedures (including critical care time)  DIAGNOSTIC STUDIES: Oxygen Saturation is 95% on RA, adequate by my interpretation.    COORDINATION OF CARE: 6:21 PM- Will order CXR. Pt advised of plan for treatment and pt agrees.  Labs Review Labs Reviewed - No data to display  Imaging Review Dg Chest 2 View  11/24/2014   CLINICAL DATA:  Cough, congestion for a few days. History of hypertension. Ex-smoker. Initial encounter.  EXAM: CHEST  2 VIEW  COMPARISON:  10/02/2013 and 03/25/2013 radiographs.  FINDINGS: The heart size and mediastinal contours are stable with aortic tortuosity. The lungs are clear. There is no pleural effusion or pneumothorax. The bones appear unchanged.  IMPRESSION: Stable  aortic tortuosity.  No acute cardiopulmonary process.   Electronically Signed   By: Carey Bullocks M.D.   On: 11/24/2014 18:58     EKG Interpretation None      MDM   Final diagnoses:  Upper respiratory infection    No signs of bacterial pneumonia. Well appearing, no hypoxia, bronchospasm or respiratory distress. Will treat symptomatically with cough syrup and recommend f/u with PCP.  I personally performed the services described in this documentation, which was scribed in my presence. The recorded information has been reviewed and is accurate.    Audree Camel, MD 11/25/14 [phone]

## 2014-11-24 NOTE — ED Notes (Signed)
Reports productive cough since Sunday. Subjective fever at home

## 2015-02-23 ENCOUNTER — Emergency Department (HOSPITAL_BASED_OUTPATIENT_CLINIC_OR_DEPARTMENT_OTHER)
Admission: EM | Admit: 2015-02-23 | Discharge: 2015-02-23 | Disposition: A | Discharge status: Home / Self Care | Payer: Medicare Other | Attending: Emergency Medicine | Admitting: Emergency Medicine

## 2015-02-23 ENCOUNTER — Encounter (HOSPITAL_BASED_OUTPATIENT_CLINIC_OR_DEPARTMENT_OTHER): Payer: Self-pay | Admitting: *Deleted

## 2015-02-23 DIAGNOSIS — Y998 Other external cause status: Secondary | ICD-10-CM | POA: Insufficient documentation

## 2015-02-23 DIAGNOSIS — Z720 Tobacco use: Secondary | ICD-10-CM | POA: Insufficient documentation

## 2015-02-23 DIAGNOSIS — Y9289 Other specified places as the place of occurrence of the external cause: Secondary | ICD-10-CM | POA: Insufficient documentation

## 2015-02-23 DIAGNOSIS — T465X5A Adverse effect of other antihypertensive drugs, initial encounter: Secondary | ICD-10-CM | POA: Insufficient documentation

## 2015-02-23 DIAGNOSIS — T783XXA Angioneurotic edema, initial encounter: Secondary | ICD-10-CM

## 2015-02-23 DIAGNOSIS — M25462 Effusion, left knee: Secondary | ICD-10-CM | POA: Insufficient documentation

## 2015-02-23 DIAGNOSIS — I10 Essential (primary) hypertension: Secondary | ICD-10-CM | POA: Insufficient documentation

## 2015-02-23 DIAGNOSIS — E780 Pure hypercholesterolemia: Secondary | ICD-10-CM | POA: Insufficient documentation

## 2015-02-23 DIAGNOSIS — X58XXXA Exposure to other specified factors, initial encounter: Secondary | ICD-10-CM | POA: Insufficient documentation

## 2015-02-23 DIAGNOSIS — R234 Changes in skin texture: Secondary | ICD-10-CM

## 2015-02-23 DIAGNOSIS — Z79899 Other long term (current) drug therapy: Secondary | ICD-10-CM | POA: Insufficient documentation

## 2015-02-23 DIAGNOSIS — Y9389 Activity, other specified: Secondary | ICD-10-CM | POA: Insufficient documentation

## 2015-02-23 MED ORDER — FAMOTIDINE 20 MG PO TABS: 20.0000 mg | ORAL_TABLET | Freq: Every day | ORAL | Status: DC

## 2015-02-23 MED ORDER — DIPHENHYDRAMINE HCL 25 MG PO CAPS
25.0000 mg | ORAL_CAPSULE | Freq: Once | ORAL | Status: AC
  Administered 2015-02-23: 25 mg via ORAL
  Filled 2015-02-23: qty 1

## 2015-02-23 MED ORDER — DIPHENHYDRAMINE HCL 25 MG PO CAPS: 25.0000 mg | ORAL_CAPSULE | Freq: Three times a day (TID) | ORAL | Status: DC | PRN

## 2015-02-23 MED ORDER — FAMOTIDINE 20 MG PO TABS
20.0000 mg | ORAL_TABLET | Freq: Once | ORAL | Status: AC
  Administered 2015-02-23: 20 mg via ORAL
  Filled 2015-02-23: qty 1

## 2015-02-23 NOTE — ED Notes (Signed)
Swelling to chin, rt bullock onset yesterday and left knee swelling onset Monday

## 2015-02-23 NOTE — ED Notes (Signed)
Swelling to chin and rt buttlock  onset yesterday  And left knee on monday

## 2015-02-23 NOTE — ED Provider Notes (Signed)
CSN: [id number]     Arrival date & time 02/23/15  0305 History   First MD Initiated Contact with Patient 02/23/15 0316     Chief Complaint  Patient presents with  . Facial Swelling     (Consider location/radiation/quality/duration/timing/severity/associated sxs/prior Treatment) HPI  This a 64 year old male who presents with swelling of the chin and left knee. Patient reports onset of symptoms 2 days ago. He noted swelling and itching of the left knee. He states that yesterday he noted a bump over his chin which is gotten progressively larger. He also states he is noted swelling and a possible boil over his right butt cheek. Patient denies any new medications, foods, or ingestions. He is unsure whether he got bitten by anything. No recent dental procedures He does take an unknown blood pressure medication. No other systemic symptoms. He has not taken anything at home for his symptoms.  Past Medical History  Diagnosis Date  . Hypertension   . High cholesterol    History reviewed. No pertinent past surgical history. No family history on file. History  Substance Use Topics  . Smoking status: Current Some Day Smoker -- 0.50 packs/day    Types: Cigarettes    Last Attempt to Quit: 09/05/2014  . Smokeless tobacco: Not on file  . Alcohol Use: Yes    Review of Systems  HENT: Negative for sore throat, trouble swallowing and voice change.   Musculoskeletal:       Chin and left knee swelling  Skin: Negative for color change and wound.  All other systems reviewed and are negative.     Allergies  Review of patient's allergies indicates no known allergies.  Home Medications   Prior to Admission medications   Medication Sig Start Date End Date Taking? Authorizing Provider  diphenhydrAMINE (BENADRYL) 25 mg capsule Take 1 capsule (25 mg total) by mouth every 8 (eight) hours as needed for itching. 02/23/15   Shon Baton, MD  famotidine (PEPCID) 20 MG tablet Take 1 tablet (20 mg total)  by mouth daily. 02/23/15   Shon Baton, MD  guaiFENesin-codeine 100-10 MG/5ML syrup Take 5 mLs by mouth 3 (three) times daily as needed for cough. 11/24/14   Pricilla Loveless, MD  HYDROcodone-acetaminophen (NORCO/VICODIN) 5-325 MG per tablet Take 1-2 tablets by mouth every 4 (four) hours as needed. 09/19/14   Teressa Lower, NP  UNKNOWN TO PATIENT meds for BP, cholesterol    Historical Provider, MD   BP 146/74 mmHg  Pulse 80  Temp(Src) 98.7 F (37.1 C) (Oral)  Resp 20  Ht  (1.778 m)  Wt 243 lb (110.224 kg)  BMI 34.87 kg/m2  SpO2 97% Physical Exam  Constitutional: He is oriented to person, place, and time. He appears well-developed and well-nourished. No distress.  HENT:  Head: Normocephalic and atraumatic.  Mouth/Throat: Oropharynx is clear and moist.  There is swelling limited to the anterior chin approximately 3 cm x 2 cm, it does not extend into the submandibular or submental space, patient can elevate tongue, poor dentition with multiple missing teeth  Eyes: Pupils are equal, round, and reactive to light.  Neck: Neck supple.  Cardiovascular: Normal rate and regular rhythm.   Pulmonary/Chest: Effort normal. No respiratory distress.  Musculoskeletal: He exhibits no edema.  Mild swelling of the left knee, no tenderness to palpation, no significant overlying skin changes, range of motion normal, excoriations and mild associated erythema  Lymphadenopathy:    He has no cervical adenopathy.  Neurological: He is alert  and oriented to person, place, and time.  Skin: Skin is warm and dry.  Induration without swelling, erythema, or abscess noted over the right medial buttock  Psychiatric: He has a normal mood and affect.  Nursing note and vitals reviewed.   ED Course  Procedures (including critical care time) Labs Review Labs Reviewed - No data to display  Imaging Review No results found.   EKG Interpretation None      MDM   Final diagnoses:  Angioedema, initial  encounter  Induration of skin    Patient presents with swelling of the chin and left knee. Swelling of the chin is isolated to the anterior chin and does not extend into the submental or submandibular spaces. No evidence of Ludwigs or peridontal abscess. Patient is on an unknown blood pressure medication and the swelling could represent angioedema. Patient notes itching and swelling of the left knee as well. There is no obvious infection or injury. Regarding patient's buttock, patient may have early abscess forming over the right buttock. No indication I and D this time. This was confirmed by bedside ultrasound.  Discussed with patient my thoughts that this is likely angioedema versus allergic reaction. Patient given Pepcid and Benadryl. Patient to discontinue blood pressure medication if it is an ACE or ARB.  He does have a primary physician who is to follow-up with closely.  After history, exam, and medical workup I feel the patient has been appropriately medically screened and is safe for discharge home. Pertinent diagnoses were discussed with the patient. Patient was given return precautions.     Shon Batonourtney F Horton, MD 02/23/15 [id number]

## 2015-02-23 NOTE — Discharge Instructions (Signed)
You were seen today for swelling of the chin and swelling of the left knee with itchiness. Your symptoms may be related to angioedema. This can be caused by certain blood pressure medications. If you are on an ACE inhibitor or ARB, you should stop those medications. Take Pepcid and Benadryl at home. He should follow-up closely with her primary physician.  Angioedema Angioedema is a sudden swelling of tissues, often of the skin. It can occur on the face or genitals or in the abdomen or other body parts. The swelling usually develops over a short period and gets better in 24 to 48 hours. It often begins during the night and is found when the person wakes up. The person may also get red, itchy patches of skin (hives). Angioedema can be dangerous if it involves swelling of the air passages.  Depending on the cause, episodes of angioedema may only happen once, come back in unpredictable patterns, or repeat for several years and then gradually fade away.  CAUSES  Angioedema can be caused by an allergic reaction to various triggers. It can also result from nonallergic causes, including reactions to drugs, immune system disorders, viral infections, or an abnormal gene that is passed to you from your parents (hereditary). For some people with angioedema, the cause is unknown.  Some things that can trigger angioedema include:   Foods.   Medicines, such as ACE inhibitors, ARBs, nonsteroidal anti-inflammatory agents, or estrogen.   Latex.   Animal saliva.   Insect stings.   Dyes used in X-rays.   Mild injury.   Dental work.  Surgery.  Stress.   Sudden changes in temperature.   Exercise. SIGNS AND SYMPTOMS   Swelling of the skin.  Hives. If these are present, there is also intense itching.  Redness in the affected area.   Pain in the affected area.  Swollen lips or tongue.  Breathing problems. This may happen if the air passages swell.  Wheezing. If internal organs are  involved, there may be:   Nausea.   Abdominal pain.   Vomiting.   Difficulty swallowing.   Difficulty passing urine. DIAGNOSIS   Your health care provider will examine the affected area and take a medical and family history.  Various tests may be done to help determine the cause. Tests may include:  Allergy skin tests to see if the problem is an allergic reaction.   Blood tests to check for hereditary angioedema.   Tests to check for underlying diseases that could cause the condition.   A review of your medicines, including over-the-counter medicines, may be done. TREATMENT  Treatment will depend on the cause of the angioedema. Possible treatments include:   Removal of anything that triggered the condition (such as stopping certain medicines).   Medicines to treat symptoms or prevent attacks. Medicines given may include:   Antihistamines.   Epinephrine injection.   Steroids.   Hospitalization may be required for severe attacks. If the air passages are affected, it can be an emergency. Tubes may need to be placed to keep the airway open. HOME CARE INSTRUCTIONS   Take all medicines as directed by your health care provider.  If you were given medicines for emergency allergy treatment, always carry them with you.  Wear a medical bracelet as directed by your health care provider.   Avoid known triggers. SEEK MEDICAL CARE IF:   You have repeat attacks of angioedema.   Your attacks are more frequent or more severe despite preventive measures.  You have hereditary angioedema and are considering having children. It is important to discuss with your health care provider the risks of passing the condition on to your children. SEEK IMMEDIATE MEDICAL CARE IF:   You have severe swelling of the mouth, tongue, or lips.  You have difficulty breathing.   You have difficulty swallowing.   You faint. MAKE SURE YOU:  Understand these instructions.  Will  watch your condition.  Will get help right away if you are not doing well or get worse. Document Released: 11/18/2001 Document Revised: 01/24/2014 Document Reviewed: 05/03/2013 [facility] Patient Information 2015 [employer] [place]. This information is not intended to replace advice given to you by your health care provider. Make sure you discuss any questions you have with your health care provider.

## 2015-03-14 ENCOUNTER — Encounter (HOSPITAL_BASED_OUTPATIENT_CLINIC_OR_DEPARTMENT_OTHER): Payer: Self-pay | Admitting: Emergency Medicine

## 2015-03-14 ENCOUNTER — Emergency Department (HOSPITAL_BASED_OUTPATIENT_CLINIC_OR_DEPARTMENT_OTHER)
Admission: EM | Admit: 2015-03-14 | Discharge: 2015-03-14 | Disposition: A | Discharge status: Home / Self Care | Payer: Medicare Other | Attending: Emergency Medicine | Admitting: Emergency Medicine

## 2015-03-14 DIAGNOSIS — Z720 Tobacco use: Secondary | ICD-10-CM | POA: Insufficient documentation

## 2015-03-14 DIAGNOSIS — I10 Essential (primary) hypertension: Secondary | ICD-10-CM | POA: Insufficient documentation

## 2015-03-14 DIAGNOSIS — Z79899 Other long term (current) drug therapy: Secondary | ICD-10-CM | POA: Insufficient documentation

## 2015-03-14 DIAGNOSIS — L739 Follicular disorder, unspecified: Secondary | ICD-10-CM

## 2015-03-14 DIAGNOSIS — Z8639 Personal history of other endocrine, nutritional and metabolic disease: Secondary | ICD-10-CM | POA: Insufficient documentation

## 2015-03-14 MED ORDER — CEPHALEXIN 250 MG PO CAPS
500.0000 mg | ORAL_CAPSULE | Freq: Once | ORAL | Status: AC
Start: 2015-03-14 — End: 2015-03-14
  Administered 2015-03-14: 500 mg via ORAL
  Filled 2015-03-14: qty 2

## 2015-03-14 MED ORDER — CEPHALEXIN 500 MG PO CAPS: 500.0000 mg | ORAL_CAPSULE | Freq: Three times a day (TID) | ORAL | Status: DC

## 2015-03-14 NOTE — ED Provider Notes (Signed)
CSN: [id number]     Arrival date & time 03/14/15  1842 History  This chart was scribed for Wayne Porter, MD by Octavia Heir, ED Scribe. This patient was seen in room MH02/MH02 and the patient's care was started at 7:15 PM.    Chief Complaint  Patient presents with  . Skin Problem     The history is provided by the patient. No language interpreter was used.    HPI Comments: Wayne York is a 64 y.o. male who presents to the Emergency Department complaining of "knots" on his head onset 3 days ago. Pt notes notes he was seen by the [facility] for similar symptoms and was given prednisone but the knots did not go away.  Past Medical History  Diagnosis Date  . Hypertension   . High cholesterol    History reviewed. No pertinent past surgical history. History reviewed. No pertinent family history. History  Substance Use Topics  . Smoking status: Current Some Day Smoker -- 0.50 packs/day    Types: Cigarettes    Last Attempt to Quit: 09/05/2014  . Smokeless tobacco: Not on file  . Alcohol Use: Yes    Review of Systems  Unable to perform ROS Constitutional: Negative for fever, chills, diaphoresis, appetite change and fatigue.  HENT: Negative for mouth sores, sore throat and trouble swallowing.   Eyes: Negative for visual disturbance.  Respiratory: Negative for cough, chest tightness, shortness of breath and wheezing.   Cardiovascular: Negative for chest pain.  Gastrointestinal: Negative for nausea, vomiting, abdominal pain, diarrhea and abdominal distention.  Endocrine: Negative for polydipsia, polyphagia and polyuria.  Genitourinary: Negative for dysuria, frequency and hematuria.  Musculoskeletal: Negative for gait problem.  Skin: Positive for rash. Negative for color change and pallor.  Neurological: Negative for dizziness, syncope, light-headedness and headaches.  Hematological: Does not bruise/bleed easily.  Psychiatric/Behavioral: Negative for behavioral problems and confusion.  All  other systems reviewed and are negative.     Allergies  Review of patient's allergies indicates no known allergies.  Home Medications   Prior to Admission medications   Medication Sig Start Date End Date Taking? Authorizing Provider  cephALEXin (KEFLEX) 500 MG capsule Take 1 capsule (500 mg total) by mouth 3 (three) times daily. 03/14/15   Wayne Porter, MD  diphenhydrAMINE (BENADRYL) 25 mg capsule Take 1 capsule (25 mg total) by mouth every 8 (eight) hours as needed for itching. 02/23/15   Shon Baton, MD  famotidine (PEPCID) 20 MG tablet Take 1 tablet (20 mg total) by mouth daily. 02/23/15   Shon Baton, MD  guaiFENesin-codeine 100-10 MG/5ML syrup Take 5 mLs by mouth 3 (three) times daily as needed for cough. 11/24/14   Pricilla Loveless, MD  HYDROcodone-acetaminophen (NORCO/VICODIN) 5-325 MG per tablet Take 1-2 tablets by mouth every 4 (four) hours as needed. 09/19/14   Teressa Lower, NP  UNKNOWN TO PATIENT meds for BP, cholesterol    Historical Provider, MD   Triage vitals: BP 130/78 mmHg  Pulse 102  Temp(Src) 98.9 F (37.2 C) (Oral)  Resp 16  Ht 5\' 10"  (1.778 m)  Wt 246 lb (111.585 kg)  BMI 35.30 kg/m2  SpO2 96% Physical Exam  Constitutional: He is oriented to person, place, and time. He appears well-developed and well-nourished. No distress.  HENT:  Head: Normocephalic.  Occipital midline 1-2cm in duration  Eyes: Conjunctivae are normal. Pupils are equal, round, and reactive to light. No scleral icterus.  Neck: Normal range of motion. Neck supple. No thyromegaly present.  Cardiovascular:  Normal rate and regular rhythm.  Exam reveals no gallop and no friction rub.   No murmur heard. Pulmonary/Chest: Effort normal and breath sounds normal. No respiratory distress. He has no wheezes. He has no rales.  Abdominal: Soft. Bowel sounds are normal. He exhibits no distension. There is no tenderness. There is no rebound.  Musculoskeletal: Normal range of motion.  Neurological: He  is alert and oriented to person, place, and time.  Skin: Skin is warm and dry. No rash noted.  Psychiatric: He has a normal mood and affect. His behavior is normal.  Nursing note and vitals reviewed.   ED Course  Procedures  DIAGNOSTIC STUDIES: Oxygen Saturation is 96% on RA, normal by my interpretation.  COORDINATION OF CARE:  7:18 PM Discussed treatment plan which includes antibiotics with pt at bedside and pt agreed to plan.  Labs Review Labs Reviewed - No data to display  Imaging Review No results found.   EKG Interpretation None      MDM   Final diagnoses:  Folliculitis   I personally performed the services described in this documentation, which was scribed in my presence. The recorded information has been reviewed and is accurate.  Advised to not razor shaved the area until his symptoms have resolved. Then may consider not razor and to avoid additional future episodes   Wayne Porter, MD 03/14/15 1930

## 2015-03-14 NOTE — Discharge Instructions (Signed)
Folliculitis  Folliculitis is redness, soreness, and swelling (inflammation) of the hair follicles. This condition can occur anywhere on the body. People with weakened immune systems, diabetes, or obesity have a greater risk of getting folliculitis. CAUSES  Bacterial infection. This is the most common cause.  Fungal infection.  Viral infection.  Contact with certain chemicals, especially oils and tars. Long-term folliculitis can result from bacteria that live in the nostrils. The bacteria may trigger multiple outbreaks of folliculitis over time. SYMPTOMS Folliculitis most commonly occurs on the scalp, thighs, legs, back, buttocks, and areas where hair is shaved frequently. An early sign of folliculitis is a small, white or yellow, pus-filled, itchy lesion (pustule). These lesions appear on a red, inflamed follicle. They are usually less than 0.2 inches (5 mm) wide. When there is an infection of the follicle that goes deeper, it becomes a boil or furuncle. A group of closely packed boils creates a larger lesion (carbuncle). Carbuncles tend to occur in hairy, sweaty areas of the body. DIAGNOSIS  Your caregiver can usually tell what is wrong by doing a physical exam. A sample may be taken from one of the lesions and tested in a lab. This can help determine what is causing your folliculitis. TREATMENT  Treatment may include:  Applying warm compresses to the affected areas.  Taking antibiotic medicines orally or applying them to the skin.  Draining the lesions if they contain a large amount of pus or fluid.  Laser hair removal for cases of long-lasting folliculitis. This helps to prevent regrowth of the hair. HOME CARE INSTRUCTIONS  Apply warm compresses to the affected areas as directed by your caregiver.  If antibiotics are prescribed, take them as directed. Finish them even if you start to feel better.  You may take over-the-counter medicines to relieve itching.  Do not shave  irritated skin.  Follow up with your caregiver as directed. SEEK IMMEDIATE MEDICAL CARE IF:   You have increasing redness, swelling, or pain in the affected area.  You have a fever. MAKE SURE YOU:  Understand these instructions.  Will watch your condition.  Will get help right away if you are not doing well or get worse. Document Released: 11/18/2001 Document Revised: 03/10/2012 Document Reviewed: 12/10/2011 [facility] Patient Information 2015 [place], [place]. This information is not intended to replace advice given to you by your health care provider. Make sure you discuss any questions you have with your health care provider.  Folliculitis  Folliculitis is redness, soreness, and swelling (inflammation) of the hair follicles. This condition can occur anywhere on the body. People with weakened immune systems, diabetes, or obesity have a greater risk of getting folliculitis. CAUSES  Bacterial infection. This is the most common cause.  Fungal infection.  Viral infection.  Contact with certain chemicals, especially oils and tars. Long-term folliculitis can result from bacteria that live in the nostrils. The bacteria may trigger multiple outbreaks of folliculitis over time. SYMPTOMS Folliculitis most commonly occurs on the scalp, thighs, legs, back, buttocks, and areas where hair is shaved frequently. An early sign of folliculitis is a small, white or yellow, pus-filled, itchy lesion (pustule). These lesions appear on a red, inflamed follicle. They are usually less than 0.2 inches (5 mm) wide. When there is an infection of the follicle that goes deeper, it becomes a boil or furuncle. A group of closely packed boils creates a larger lesion (carbuncle). Carbuncles tend to occur in hairy, sweaty areas of the body. DIAGNOSIS  Your caregiver can usually tell  what is wrong by doing a physical exam. A sample may be taken from one of the lesions and tested in a lab. This can help determine what is  causing your folliculitis. TREATMENT  Treatment may include:  Applying warm compresses to the affected areas.  Taking antibiotic medicines orally or applying them to the skin.  Draining the lesions if they contain a large amount of pus or fluid.  Laser hair removal for cases of long-lasting folliculitis. This helps to prevent regrowth of the hair. HOME CARE INSTRUCTIONS  Apply warm compresses to the affected areas as directed by your caregiver.  If antibiotics are prescribed, take them as directed. Finish them even if you start to feel better.  You may take over-the-counter medicines to relieve itching.  Do not shave irritated skin.  Follow up with your caregiver as directed. SEEK IMMEDIATE MEDICAL CARE IF:   You have increasing redness, swelling, or pain in the affected area.  You have a fever. MAKE SURE YOU:  Understand these instructions.  Will watch your condition.  Will get help right away if you are not doing well or get worse. Document Released: 11/18/2001 Document Revised: 03/10/2012 Document Reviewed: 12/10/2011 [facility] Patient Information 2015 [place]. This information is not intended to replace advice given to you by your health care provider. Make sure you discuss any questions you have with your health care provider.

## 2015-03-14 NOTE — ED Notes (Signed)
Pt in c/o "knots" to his head that are making his head hurt. Pt [facility] with [facility].

## 2016-11-05 IMAGING — CR DG CHEST 2V
2 series · 2 of 2 positions shown · non-contrast
Comparison: 10/02/2013 and 03/25/2013 radiographs.

CLINICAL DATA: Cough, congestion for a few days. History of
hypertension. Ex-smoker. Initial encounter.

EXAM:
CHEST  2 VIEW

[w chest pa]
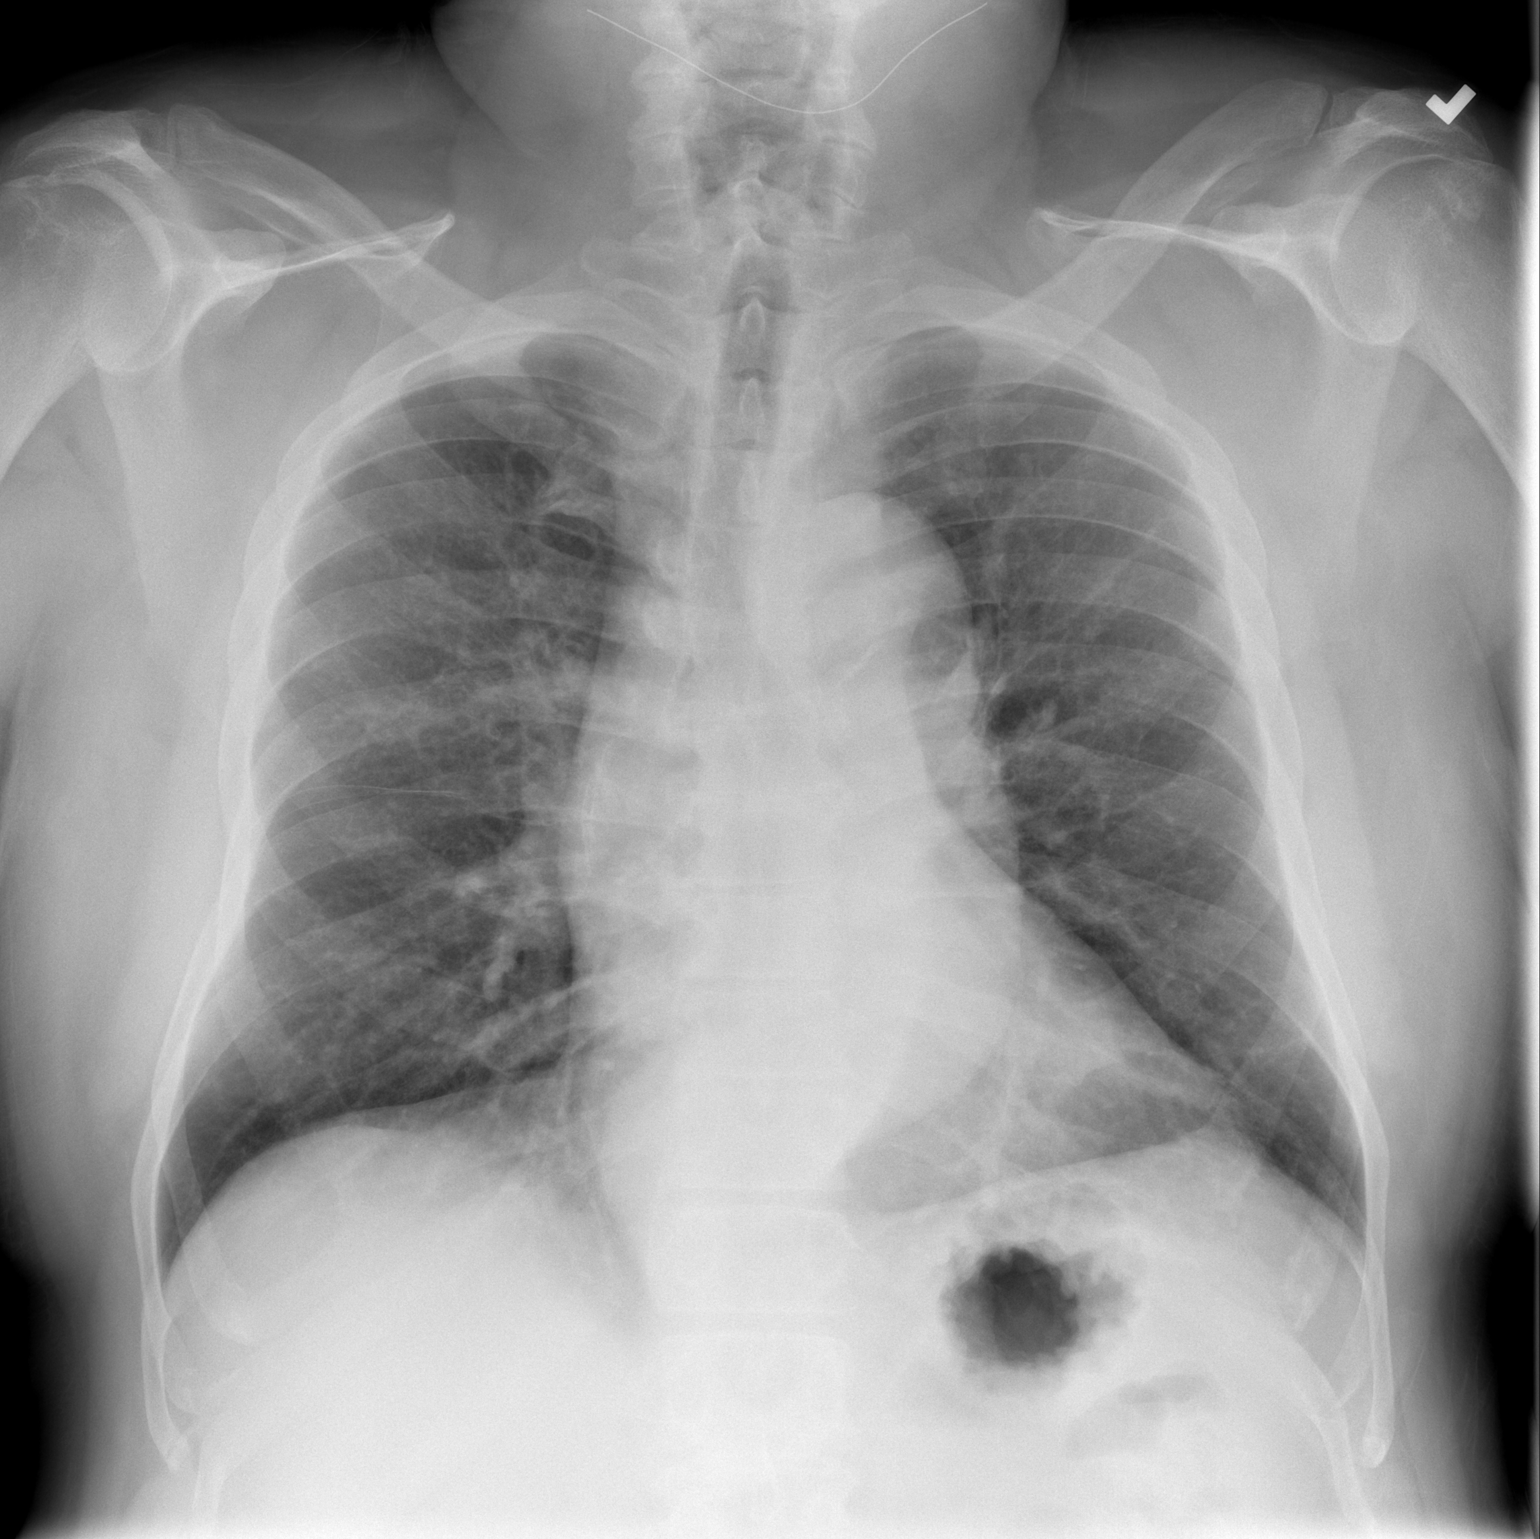

[w chest lat]
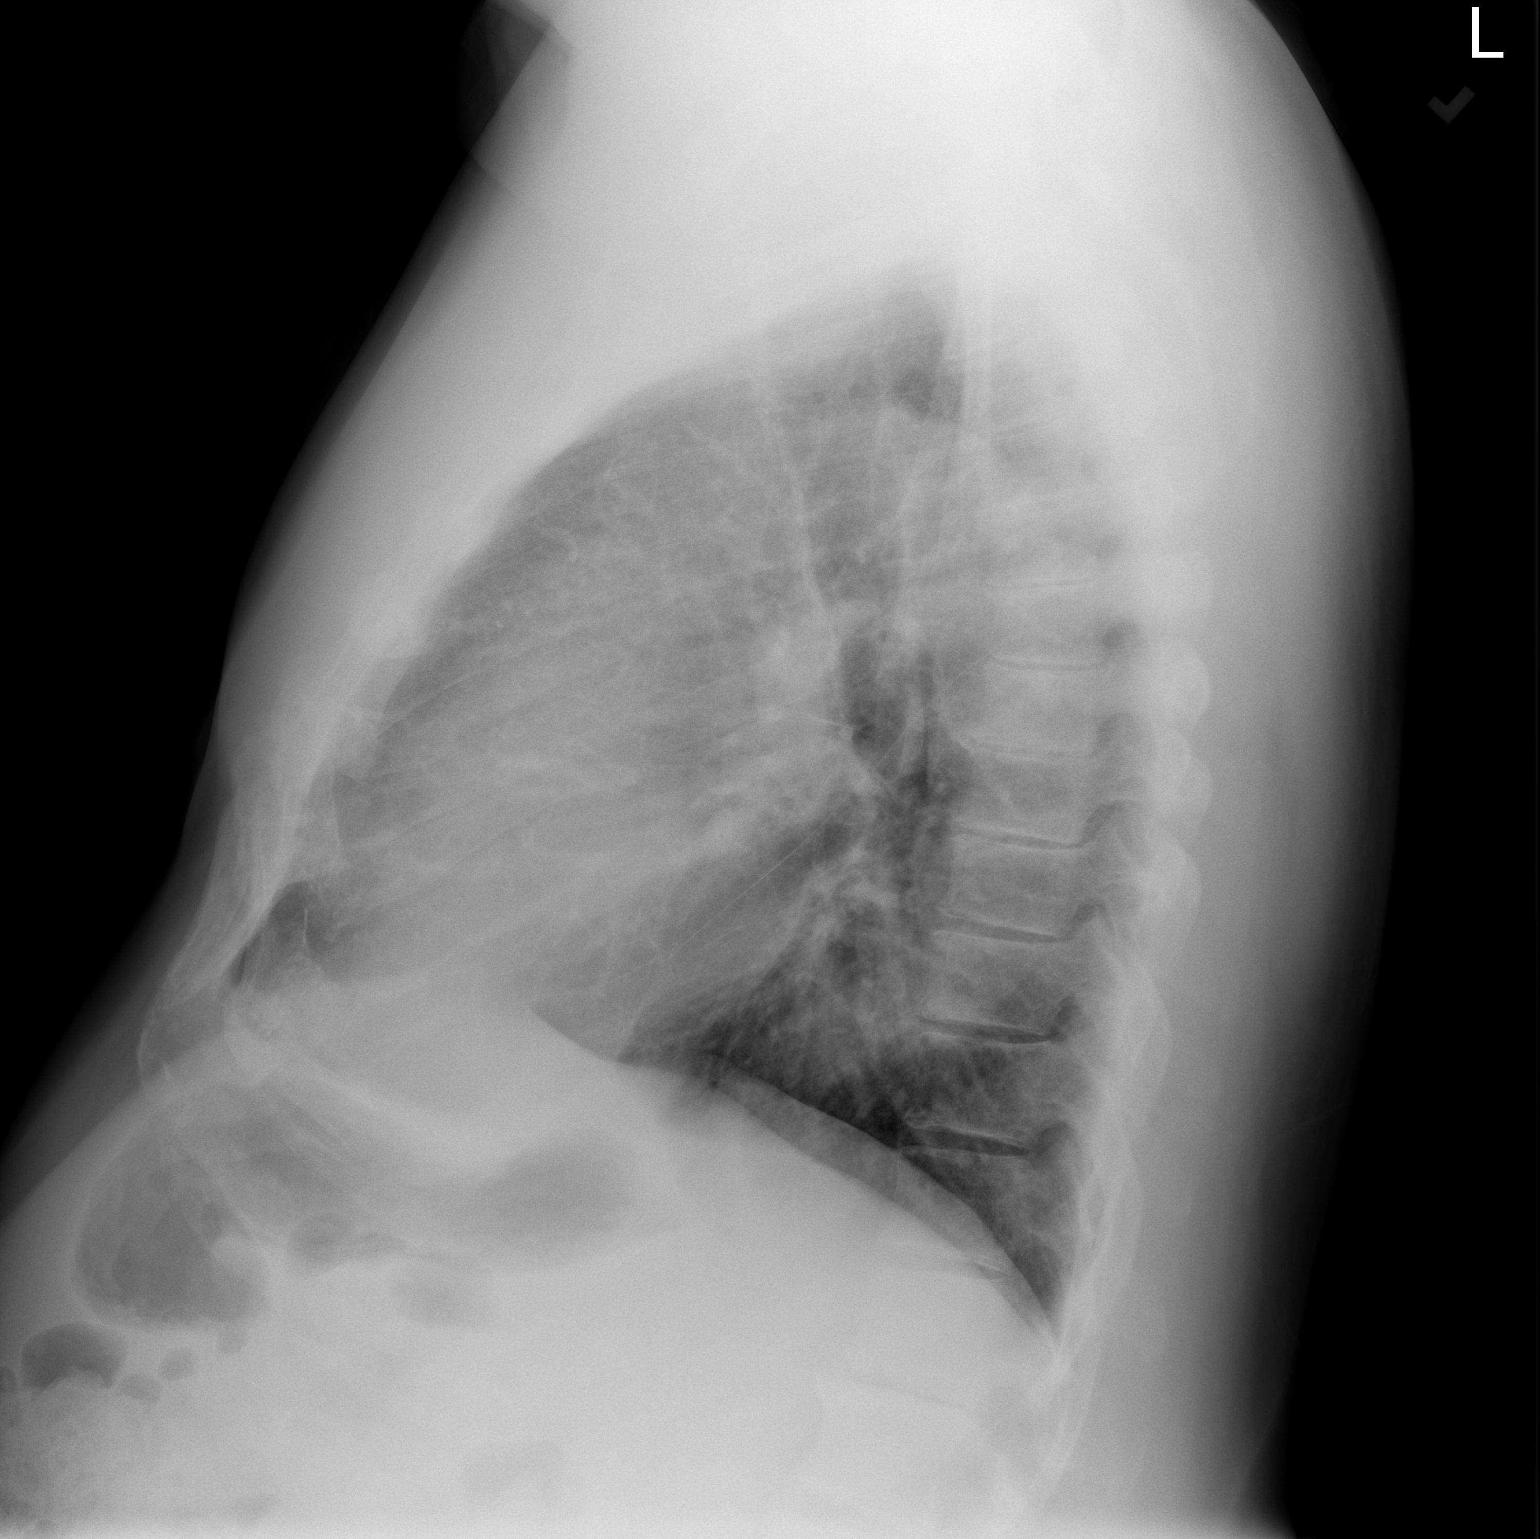

[2 of 2 positions shown; findings below may reference images not displayed]

FINDINGS: The heart size and mediastinal contours are stable with aortic
tortuosity. The lungs are clear. There is no pleural effusion or
pneumothorax. The bones appear unchanged.
IMPRESSION: Stable aortic tortuosity.  No acute cardiopulmonary process.

## 2017-04-30 ENCOUNTER — Emergency Department (HOSPITAL_BASED_OUTPATIENT_CLINIC_OR_DEPARTMENT_OTHER): Payer: Medicare Other

## 2017-04-30 ENCOUNTER — Emergency Department (HOSPITAL_BASED_OUTPATIENT_CLINIC_OR_DEPARTMENT_OTHER)
Admission: EM | Admit: 2017-04-30 | Discharge: 2017-05-01 | Disposition: A | Discharge status: Home / Self Care | Payer: Medicare Other | Attending: Emergency Medicine | Admitting: Emergency Medicine

## 2017-04-30 ENCOUNTER — Encounter (HOSPITAL_BASED_OUTPATIENT_CLINIC_OR_DEPARTMENT_OTHER): Payer: Self-pay | Admitting: *Deleted

## 2017-04-30 DIAGNOSIS — K5792 Diverticulitis of intestine, part unspecified, without perforation or abscess without bleeding: Secondary | ICD-10-CM | POA: Insufficient documentation

## 2017-04-30 DIAGNOSIS — I10 Essential (primary) hypertension: Secondary | ICD-10-CM | POA: Insufficient documentation

## 2017-04-30 DIAGNOSIS — F17210 Nicotine dependence, cigarettes, uncomplicated: Secondary | ICD-10-CM | POA: Insufficient documentation

## 2017-04-30 DIAGNOSIS — R911 Solitary pulmonary nodule: Secondary | ICD-10-CM | POA: Insufficient documentation

## 2017-04-30 LAB — COMPREHENSIVE METABOLIC PANEL
ALK PHOS: 83 U/L (ref 38–126)
ALT: 39 U/L (ref 17–63)
AST: 36 U/L (ref 15–41)
Albumin: 3.9 g/dL (ref 3.5–5.0)
Anion gap: 10 (ref 5–15)
BUN: 10 mg/dL (ref 6–20)
CALCIUM: 9 mg/dL (ref 8.9–10.3)
CO2: 24 mmol/L (ref 22–32)
Chloride: 103 mmol/L (ref 101–111)
Creatinine, Ser: 0.94 mg/dL (ref 0.61–1.24)
Glucose, Bld: 99 mg/dL (ref 65–99)
Potassium: 4.3 mmol/L (ref 3.5–5.1)
Sodium: 137 mmol/L (ref 135–145)
Total Bilirubin: 0.6 mg/dL (ref 0.3–1.2)
Total Protein: 7.4 g/dL (ref 6.5–8.1)

## 2017-04-30 LAB — URINALYSIS, ROUTINE W REFLEX MICROSCOPIC
Bilirubin Urine: NEGATIVE
GLUCOSE, UA: NEGATIVE mg/dL
HGB URINE DIPSTICK: NEGATIVE
Ketones, ur: NEGATIVE mg/dL
Leukocytes, UA: NEGATIVE
Nitrite: NEGATIVE
PH: 7.5 (ref 5.0–8.0)
Protein, ur: NEGATIVE mg/dL
SPECIFIC GRAVITY, URINE: 1.018 (ref 1.005–1.030)

## 2017-04-30 LAB — CBC WITH DIFFERENTIAL/PLATELET
Basophils Absolute: 0 10*3/uL (ref 0.0–0.1)
Basophils Relative: 0 %
Eosinophils Absolute: 0.3 10*3/uL (ref 0.0–0.7)
Eosinophils Relative: 3 %
HCT: 47.8 % (ref 39.0–52.0)
HEMOGLOBIN: 16 g/dL (ref 13.0–17.0)
Lymphocytes Relative: 35 %
Lymphs Abs: 3.3 10*3/uL (ref 0.7–4.0)
MCH: 30.3 pg (ref 26.0–34.0)
MCHC: 33.5 g/dL (ref 30.0–36.0)
MCV: 90.5 fL (ref 78.0–100.0)
MONOS PCT: 6 %
Monocytes Absolute: 0.6 10*3/uL (ref 0.1–1.0)
NEUTROS PCT: 56 %
Neutro Abs: 5.4 10*3/uL (ref 1.7–7.7)
Platelets: 195 10*3/uL (ref 150–400)
RBC: 5.28 MIL/uL (ref 4.22–5.81)
RDW: 14.3 % (ref 11.5–15.5)
WBC: 9.6 10*3/uL (ref 4.0–10.5)

## 2017-04-30 LAB — LIPASE, BLOOD: LIPASE: 30 U/L (ref 11–51)

## 2017-04-30 MED ORDER — MORPHINE SULFATE (PF) 4 MG/ML IV SOLN
4.0000 mg | Freq: Once | INTRAVENOUS | Status: AC
  Administered 2017-04-30: 4 mg via INTRAVENOUS
  Filled 2017-04-30: qty 1

## 2017-04-30 MED ORDER — DICYCLOMINE HCL 10 MG PO CAPS
10.0000 mg | ORAL_CAPSULE | Freq: Once | ORAL | Status: AC
  Administered 2017-04-30: 10 mg via ORAL
  Filled 2017-04-30: qty 1

## 2017-04-30 NOTE — ED Provider Notes (Signed)
[facility] Provider Note   CSN: [id number] Arrival date & time: 04/30/17  1813     History   Chief Complaint Chief Complaint  Patient presents with  . Abdominal Pain    HPI Wayne York is a 66 y.o. male past medical history of high cholesterol and hypertension who presents with left lower quadrant abdominal pain that began 3 days ago. Patient reports that pain has been constant since then but has now started radiating down to the left groin. Patient reports that pain is worsened with deep inspiration. He denies any other aggravating factors. He has not taken any medications for the pain. He denies any other alleviating factors. He denies any associated nausea/vomiting/diarrhea. He reports that his last bowel movement was this morning was normal. No presence of blood or melena. Patient has been able to eat and drink appropriately without any difficulty. Patient denies any recent sickness. Patient reports some occasional alcohol use and estimates drinking approximately one beer per today. Patient denies any fevers/chills, chest pain, difficulty breathing, dysuria, hematuria, penile pain, testicular pain or swelling, vomiting  The history is provided by the patient.    Past Medical History:  Diagnosis Date  . High cholesterol   . Hypertension     There are no active problems to display for this patient.   History reviewed. No pertinent surgical history.     Home Medications    Prior to Admission medications   Medication Sig Start Date End Date Taking? Authorizing Provider  ciprofloxacin (CIPRO) 500 MG tablet Take 1 tablet (500 mg total) by mouth every 12 (twelve) hours. 05/01/17   Maxwell Caul, PA-C  HYDROcodone-acetaminophen (NORCO/VICODIN) 5-325 MG tablet Take 1-2 tablets by mouth every 4 (four) hours as needed. 05/01/17   Maxwell Caul, PA-C  metroNIDAZOLE (FLAGYL) 500 MG tablet Take 1 tablet (500 mg total) by mouth 3 (three) times daily. 05/01/17   Maxwell Caul, PA-C  UNKNOWN TO PATIENT meds for BP, cholesterol    [provider]    Family History History reviewed. No pertinent family history.  Social History Social History  Substance Use Topics  . Smoking status: Current Some Day Smoker    Packs/day: 0.50    Types: Cigarettes    Last attempt to quit: 09/05/2014  . Smokeless tobacco: Not on file  . Alcohol use Yes     Allergies   Patient has no known allergies.   Review of Systems Review of Systems  Constitutional: Negative for chills and fever.  Respiratory: Negative for cough and shortness of breath.   Cardiovascular: Negative for chest pain.  Gastrointestinal: Positive for abdominal pain. Negative for blood in stool, diarrhea, nausea and vomiting.  Genitourinary: Negative for dysuria, hematuria, penile swelling, scrotal swelling and testicular pain.  Neurological: Negative for weakness.     Physical Exam Updated Vital Signs BP (!) 142/88 (BP Location: Right Arm)   Pulse 81   Temp 98.9 F (37.2 C) (Oral)   Resp 16   Ht 5\' 9"  (1.753 m)   Wt 116.7 kg (257 lb 4.8 oz)   SpO2 96%   BMI 38.00 kg/m   Physical Exam  Constitutional: He is oriented to person, place, and time. He appears well-developed and well-nourished.  Sitting comfortably on examination table  HENT:  Head: Normocephalic and atraumatic.  Mouth/Throat: Oropharynx is clear and moist and mucous membranes are normal.  Eyes: Pupils are equal, round, and reactive to light. Conjunctivae, EOM and lids are normal.  Neck:  Full passive range of motion without pain.  Cardiovascular: Normal rate, regular rhythm, normal heart sounds and normal pulses.  Exam reveals no gallop and no friction rub.   No murmur heard. Pulmonary/Chest: Effort normal and breath sounds normal.  Abdominal: Soft. Normal appearance. There is tenderness. There is no rigidity, no rebound, no guarding and no CVA tenderness. Hernia confirmed negative in the right inguinal area and  confirmed negative in the left inguinal area.  Tenderness palpation to left lower quadrant. No CVA tenderness bilaterally. No peritoneal signs  Genitourinary: Penis normal. Right testis shows no mass, no swelling and no tenderness. Left testis shows no mass, no swelling and no tenderness.  Genitourinary Comments: Normal external male genitalia. No testicular swelling, erythema, mass or tenderness. No evidence of hernia bilaterally.   Musculoskeletal: Normal range of motion.  Neurological: He is alert and oriented to person, place, and time.  Skin: Skin is warm and dry. Capillary refill takes less than 2 seconds.  Psychiatric: He has a normal mood and affect. His speech is normal.  Nursing note and vitals reviewed.    ED Treatments / Results  Labs (all labs ordered are listed, but only abnormal results are displayed) Labs Reviewed  URINALYSIS, ROUTINE W REFLEX MICROSCOPIC  COMPREHENSIVE METABOLIC PANEL  CBC WITH DIFFERENTIAL/PLATELET  LIPASE, BLOOD    EKG  EKG Interpretation None       Radiology Ct Abdomen Pelvis W Contrast  Result Date: 05/01/2017 CLINICAL DATA:  Left lower quadrant pain x3 days. EXAM: CT ABDOMEN AND PELVIS WITH CONTRAST TECHNIQUE: Multidetector CT imaging of the abdomen and pelvis was performed using the standard protocol following bolus administration of intravenous contrast. CONTRAST:  100mL ISOVUE-300 IOPAMIDOL (ISOVUE-300) INJECTION 61% COMPARISON:  None. FINDINGS: Lower chest: Three-vessel coronary arteriosclerosis. Mild cardiomegaly with aortic atherosclerosis. Lung bases are unremarkable. Tiny 3 mm left lower lobe nodule possibly a small intra fissure lymph node. Hepatobiliary: No focal liver abnormality is seen. No gallstones, gallbladder wall thickening, or biliary dilatation. Pancreas: No pancreatic ductal dilatation or surrounding inflammation. No dominant mass. Spleen: Normal in size without focal abnormality. Adrenals/Urinary Tract: Water attenuating  cysts are noted bilaterally, the largest is interpolar on the right measuring up to 5.5 cm with an upper pole 4.7 cm cyst noted. No nephrolithiasis or hydronephrosis. The ureters are free of calculi. The bladder is physiologically distended. Stomach/Bowel: Pericolonic inflammation at the junction of the descending and sigmoid colon associated with acute diverticulitis. No abscess or bowel obstruction. The stomach and small bowel are unremarkable. Normal appearing appendix. Vascular/Lymphatic: Aortic atherosclerosis. No enlarged abdominal or pelvic lymph nodes. Reproductive: Prostate is unremarkable. Other: Small fat containing umbilical hernia. 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 IMPRESSION: 1. Acute uncomplicated diverticulitis at the junction of the descending sigmoid colon. 2. Tiny 3 mm nodule in the left lower lobe possibly representing small intra fissure lymph node. No follow-up needed if patient is low-risk. Non-contrast chest CT can be considered in 12 months if patient is high-risk. This recommendation follows the consensus statement: Guidelines for Management of Incidental Pulmonary Nodules Detected on CT Images: From the Fleischner Society 2017; Radiology 2017; 284:228-243. 3. Bilateral renal cysts without obstructive uropathy. 4. Aortic atherosclerosis. 5. Small fat containing umbilical hernia. Electronically Signed   By: Tollie Ethavid  Kwon M.D.   On: 05/01/2017 01:31    Procedures Procedures (including critical care time)  Medications Ordered in ED Medications  dicyclomine (BENTYL) capsule 10 mg (10 mg Oral Given 04/30/17  2209)  morphine 4 MG/ML injection 4 mg (4 mg Intravenous Given 04/30/17 2354)  iopamidol (ISOVUE-300) 61 % injection 100 mL (100 mLs Intravenous Contrast Given 05/01/17 0112)  metroNIDAZOLE (FLAGYL) tablet 500 mg (500 mg Oral Given 05/01/17 0150)  ciprofloxacin (CIPRO) tablet 500 mg (500 mg Oral Given 05/01/17  0150)     Initial Impression / Assessment and Plan / ED Course  I have reviewed the triage vital signs and the nursing notes.  Pertinent labs & imaging results that were available during my care of the patient were reviewed by me and considered in my medical decision making (see chart for details).     66 year old male who presents 3 days of left lower quadrant abdominal pain. No associated nausea/vomiting/diarrhea. Patient is afebrile, non-toxic appearing, sitting comfortably on examination table. Vital signs reviewed and stable. Consider acute infectious etiology versus skilled musculoskeletal pain versus UTI versus kidney stone. Urine ordered at triage. Will order additional labs according CBC, CMP, lipase. Analgesics provided in the department. Will hold off on any imaging to lab work is back.  Labs reviewed. Urine is negative for any acute signs of infection. CBC is unremarkable. CMP is unremarkable. Lipase is unremarkable. Discussed with Dr. Rush Landmark Will plan to obtain CT abdomen and pelvis for further evaluation. Updated patient on plan.   CT abdomen/pelvis reviewed. Evidence of uncomplicated diverticulitis noted at the junction of the descending. Also noted to be a tiny 3 mm nodule in the left lower lobe that should have CT evaluation in 12 months. Discussed results with patient. Vital signs stable. Patient has been able to tolerate by mouth in the department. Stable for discharge at this time. Will plan to give first dose of antibiotic here in the department. Instructed him to follow-up with the referred GI doctor for further evaluation. Provided patient with a list of clinic resources to use if he does not have a PCP. Instructed to call them today to arrange follow-up in the next 24-48 hours. Strict return precautions discussed. Patient expresses understanding and agreement to plan.    Final Clinical Impressions(s) / ED Diagnoses   Final diagnoses:  Diverticulitis  Pulmonary nodule     New Prescriptions Discharge Medication List as of 05/01/2017  1:51 AM    START taking these medications   Details  ciprofloxacin (CIPRO) 500 MG tablet Take 1 tablet (500 mg total) by mouth every 12 (twelve) hours., Starting Thu 05/01/2017, Print    HYDROcodone-acetaminophen (NORCO/VICODIN) 5-325 MG tablet Take 1-2 tablets by mouth every 4 (four) hours as needed., Starting Thu 05/01/2017, Print    metroNIDAZOLE (FLAGYL) 500 MG tablet Take 1 tablet (500 mg total) by mouth 3 (three) times daily., Starting Thu 05/01/2017, Print         Maxwell Caul, PA-C 05/01/17 0157    Tegeler, Canary Brim, MD 05/01/17 [phone]

## 2017-04-30 NOTE — ED Triage Notes (Signed)
Tender on palpation.

## 2017-04-30 NOTE — ED Triage Notes (Signed)
RLQ pain x 3 days. Denies N/V/D. Denies urinary symptoms. Ambulatory.

## 2017-04-30 NOTE — ED Notes (Signed)
ED Provider at bedside. 

## 2017-05-01 MED ORDER — METRONIDAZOLE 500 MG PO TABS: 500.0000 mg | ORAL_TABLET | Freq: Three times a day (TID) | ORAL | 0 refills | Status: AC

## 2017-05-01 MED ORDER — HYDROCODONE-ACETAMINOPHEN 5-325 MG PO TABS: 1.0000 | ORAL_TABLET | ORAL | 0 refills | Status: DC | PRN

## 2017-05-01 MED ORDER — CIPROFLOXACIN HCL 500 MG PO TABS
500.0000 mg | ORAL_TABLET | Freq: Once | ORAL | Status: AC
  Administered 2017-05-01: 500 mg via ORAL
  Filled 2017-05-01: qty 1

## 2017-05-01 MED ORDER — METRONIDAZOLE 500 MG PO TABS
500.0000 mg | ORAL_TABLET | Freq: Once | ORAL | Status: AC
  Administered 2017-05-01: 500 mg via ORAL
  Filled 2017-05-01: qty 1

## 2017-05-01 MED ORDER — IOPAMIDOL (ISOVUE-300) INJECTION 61%
100.0000 mL | Freq: Once | INTRAVENOUS | Status: AC | PRN
  Administered 2017-05-01: 100 mL via INTRAVENOUS

## 2017-05-01 MED ORDER — CIPROFLOXACIN HCL 500 MG PO TABS: 500.0000 mg | ORAL_TABLET | Freq: Two times a day (BID) | ORAL | 0 refills | Status: DC

## 2017-05-01 NOTE — Discharge Instructions (Signed)
You can take Tylenol as directed for pain. Take the pain medication for severe pain.   Take antibiotics as directed. Please take all of your antibiotics until finished.  Follow-up with referred clinic for further evaluation.  Follow-up with referred GI doctor for further evaluation.  Return the emergency Department for any fever, worsening pain, persistent nausea/vomiting, difficulty breathing, chest pain or any other worsening or concerning symptoms.  As we discussed, there was a 3 mm nodule found on the lower lung that does not need emergent evaluation. Recommendation is to have a repeat CT of the chest in 1 year to evaluate.

## 2017-05-01 NOTE — ED Notes (Signed)
Patient transported to CT 

## 2023-05-26 ENCOUNTER — Emergency Department (HOSPITAL_BASED_OUTPATIENT_CLINIC_OR_DEPARTMENT_OTHER)
Admission: EM | Admit: 2023-05-26 | Discharge: 2023-05-26 | Disposition: A | Discharge status: Home / Self Care | Payer: No Typology Code available for payment source | Attending: Emergency Medicine | Admitting: Emergency Medicine

## 2023-05-26 ENCOUNTER — Other Ambulatory Visit: Payer: Self-pay

## 2023-05-26 ENCOUNTER — Encounter (HOSPITAL_BASED_OUTPATIENT_CLINIC_OR_DEPARTMENT_OTHER): Payer: Self-pay

## 2023-05-26 DIAGNOSIS — I10 Essential (primary) hypertension: Secondary | ICD-10-CM | POA: Diagnosis not present

## 2023-05-26 DIAGNOSIS — Z79899 Other long term (current) drug therapy: Secondary | ICD-10-CM | POA: Diagnosis not present

## 2023-05-26 DIAGNOSIS — Z1152 Encounter for screening for COVID-19: Secondary | ICD-10-CM | POA: Insufficient documentation

## 2023-05-26 DIAGNOSIS — K146 Glossodynia: Secondary | ICD-10-CM | POA: Diagnosis present

## 2023-05-26 LAB — RESP PANEL BY RT-PCR (RSV, FLU A&B, COVID)  RVPGX2
Influenza A by PCR: NEGATIVE
Influenza B by PCR: NEGATIVE
Resp Syncytial Virus by PCR: NEGATIVE
SARS Coronavirus 2 by RT PCR: NEGATIVE

## 2023-05-26 MED ORDER — LIDOCAINE VISCOUS HCL 2 % MT SOLN: 5.0000 mL | Freq: Three times a day (TID) | OROMUCOSAL | 0 refills | Status: DC

## 2023-05-26 MED ORDER — LIDOCAINE VISCOUS HCL 2 % MT SOLN
5.0000 mL | Freq: Three times a day (TID) | OROMUCOSAL | 0 refills | Status: DC
  Filled 2023-05-26: qty 360, 24d supply, fill #0

## 2023-05-26 NOTE — ED Provider Notes (Signed)
[place] [facility] AT [facility] Provider Note   CSN: [id number] Arrival date & time: 05/26/23  1144     History  Chief Complaint  Patient presents with   Tongue Complaint    Wayne York is a 72 y.o. male.  HPI   72 year old male with medical history significant for HTN, HLD, diverticulitis with lower GI bleed who presents to the emergency department with concern for tongue pain.  The patient states that he has had a burning feeling to his tongue for the past 3 days.  He states that he has had no numbness to the time.  No lesions on his tongue.  He denies any headache, vision changes blurred vision, numbness or tingling to the extremities.  No trauma to the tongue.  He denies any bleeding from the gums.  He denies any redness or swelling along his gumline.  No fevers or chills or sick contacts.  Home Medications Prior to Admission medications   Medication Sig Start Date End Date Taking? Authorizing Provider  albuterol (VENTOLIN HFA) 108 (90 Base) MCG/ACT inhaler Inhale 2 puffs into the lungs every 4 (four) hours as needed for wheezing. 08/28/20  Yes [provider]  amLODipine (NORVASC) 5 MG tablet Take 1 tablet by mouth daily. 08/28/20  Yes [provider]  cetirizine (ZYRTEC) 10 MG tablet Take 1 tablet by mouth daily. 12/06/19  Yes [provider]  gabapentin (NEURONTIN) 300 MG capsule Take 600 mg by mouth 2 (two) times daily. 12/26/20  Yes [provider]  losartan (COZAAR) 100 MG tablet Take 100 mg by mouth daily. 08/28/20  Yes [provider]  pravastatin (PRAVACHOL) 40 MG tablet Take 40 mg by mouth daily. 01/01/23  Yes [provider]  traZODone (DESYREL) 50 MG tablet Take 50 mg by mouth at bedtime as needed for sleep. 05/10/21  Yes [provider]  metroNIDAZOLE (FLAGYL) 500 MG tablet Take 1 tablet (500 mg total) by mouth 3 (three) times daily. 05/01/17   Maxwell Caul, PA-C      Allergies     Patient has no known allergies.    Review of Systems   Review of Systems  All other systems reviewed and are negative.   Physical Exam Updated Vital Signs BP (!) 172/95 (BP Location: Left Arm)   Pulse 88   Temp 98.3 F (36.8 C) (Oral)   Resp 17   Ht 5\' 8"  (1.727 m)   Wt 111.1 kg   SpO2 96%   BMI 37.25 kg/m  Physical Exam Vitals and nursing note reviewed.  Constitutional:      General: He is not in acute distress. HENT:     Head: Normocephalic and atraumatic.     Comments: Tongue midline, mucous membranes moist, no vesicular lesions, no erythema or discharge.  Gumline intact with no bleeding, no purulence or swelling to suggest infectious etiology Eyes:     Conjunctiva/sclera: Conjunctivae normal.     Pupils: Pupils are equal, round, and reactive to light.  Cardiovascular:     Rate and Rhythm: Normal rate and regular rhythm.  Pulmonary:     Effort: Pulmonary effort is normal. No respiratory distress.  Abdominal:     General: There is no distension.     Tenderness: There is no guarding.  Musculoskeletal:        General: No deformity or signs of injury.     Cervical back: Neck supple.  Skin:    Findings: No lesion or rash.  Neurological:  General: No focal deficit present.     Mental Status: He is alert. Mental status is at baseline.     ED Results / Procedures / Treatments   Labs (all labs ordered are listed, but only abnormal results are displayed) Labs Reviewed  RESP PANEL BY RT-PCR (RSV, FLU A&B, COVID)  RVPGX2    EKG None  Radiology No results found.  Procedures Procedures    Medications Ordered in ED Medications - No data to display  ED Course/ Medical Decision Making/ A&P                                 Medical Decision Making   72 year old male with medical history significant for HTN, HLD, diverticulitis with lower GI bleed who presents to the emergency department with concern for tongue pain.  The patient states that he has had a  burning feeling to his tongue for the past 3 days.  He states that he has had no numbness to the time.  No lesions on his tongue.  He denies any headache, vision changes blurred vision, numbness or tingling to the extremities.  No trauma to the tongue.  He denies any bleeding from the gums.  He denies any redness or swelling along his gumline.  No fevers or chills or sick contacts.  On arrival, the patient was vitally stable.  Presenting with a burning sensation to the tongue for the past few days.  Considered viral infection such as COVID-19, HSV.  No vesicular lesions or mucosal lesions noted in the patient's mouth.  No clear infectious etiology.  No bleeding from the gums, no evidence of odontogenic infection.  Over 19 influenza PCR testing was collected and resulted negative.  Low concern for emergent etiology requiring inpatient hospitalization and workup.  Recommended Magic mouthwash, follow-up with his PCP to ensure resolution and for recheck.    Final Clinical Impression(s) / ED Diagnoses Final diagnoses:  Tongue burning sensation    Rx / DC Orders ED Discharge Orders     None         Ernie Avena, MD 05/26/23 1506

## 2023-05-26 NOTE — ED Triage Notes (Signed)
In for eval of burning feeling to tongue for 3 days. Reports that his tongue was white once but appears pink in color now. Denies numbness to tongue. Tongue midline. Denies headache, blurred vision, dizziness, or numbness/tingling to extremities. Denies trauma to tongue.

## 2023-05-26 NOTE — Discharge Instructions (Addendum)
The etiology of the burning in your tongue is unclear.  No obvious evidence of infectious etiology. Covid and Flu testing was normal. Try magic mouthwash to treat. Recommend you follow-up outpatient with your primary care physician.

## 2023-05-27 ENCOUNTER — Other Ambulatory Visit (HOSPITAL_BASED_OUTPATIENT_CLINIC_OR_DEPARTMENT_OTHER): Payer: Self-pay

## 2023-05-28 ENCOUNTER — Other Ambulatory Visit (HOSPITAL_BASED_OUTPATIENT_CLINIC_OR_DEPARTMENT_OTHER): Payer: Self-pay

## 2024-01-09 ENCOUNTER — Emergency Department (HOSPITAL_BASED_OUTPATIENT_CLINIC_OR_DEPARTMENT_OTHER)
Admission: EM | Admit: 2024-01-09 | Discharge: 2024-01-09 | Disposition: A | Discharge status: Home / Self Care | Attending: Emergency Medicine | Admitting: Emergency Medicine

## 2024-01-09 ENCOUNTER — Encounter (HOSPITAL_BASED_OUTPATIENT_CLINIC_OR_DEPARTMENT_OTHER): Payer: Self-pay

## 2024-01-09 ENCOUNTER — Other Ambulatory Visit: Payer: Self-pay

## 2024-01-09 ENCOUNTER — Emergency Department (HOSPITAL_BASED_OUTPATIENT_CLINIC_OR_DEPARTMENT_OTHER)

## 2024-01-09 DIAGNOSIS — Z20828 Contact with and (suspected) exposure to other viral communicable diseases: Secondary | ICD-10-CM | POA: Insufficient documentation

## 2024-01-09 DIAGNOSIS — R0602 Shortness of breath: Secondary | ICD-10-CM | POA: Diagnosis not present

## 2024-01-09 DIAGNOSIS — R059 Cough, unspecified: Secondary | ICD-10-CM | POA: Diagnosis present

## 2024-01-09 DIAGNOSIS — R0981 Nasal congestion: Secondary | ICD-10-CM | POA: Diagnosis not present

## 2024-01-09 DIAGNOSIS — M791 Myalgia, unspecified site: Secondary | ICD-10-CM | POA: Diagnosis not present

## 2024-01-09 DIAGNOSIS — F17200 Nicotine dependence, unspecified, uncomplicated: Secondary | ICD-10-CM | POA: Diagnosis not present

## 2024-01-09 DIAGNOSIS — J3489 Other specified disorders of nose and nasal sinuses: Secondary | ICD-10-CM | POA: Insufficient documentation

## 2024-01-09 DIAGNOSIS — Z79899 Other long term (current) drug therapy: Secondary | ICD-10-CM | POA: Diagnosis not present

## 2024-01-09 DIAGNOSIS — J111 Influenza due to unidentified influenza virus with other respiratory manifestations: Secondary | ICD-10-CM

## 2024-01-09 DIAGNOSIS — I10 Essential (primary) hypertension: Secondary | ICD-10-CM | POA: Insufficient documentation

## 2024-01-09 LAB — CBC WITH DIFFERENTIAL/PLATELET
Abs Immature Granulocytes: 0.02 10*3/uL (ref 0.00–0.07)
Basophils Absolute: 0 10*3/uL (ref 0.0–0.1)
Basophils Relative: 0 %
Eosinophils Absolute: 0.1 10*3/uL (ref 0.0–0.5)
Eosinophils Relative: 1 %
HCT: 45.2 % (ref 39.0–52.0)
Hemoglobin: 14.5 g/dL (ref 13.0–17.0)
Immature Granulocytes: 0 %
Lymphocytes Relative: 21 %
Lymphs Abs: 1.2 10*3/uL (ref 0.7–4.0)
MCH: 26.1 pg (ref 26.0–34.0)
MCHC: 32.1 g/dL (ref 30.0–36.0)
MCV: 81.4 fL (ref 80.0–100.0)
Monocytes Absolute: 0.5 10*3/uL (ref 0.1–1.0)
Monocytes Relative: 10 %
Neutro Abs: 3.8 10*3/uL (ref 1.7–7.7)
Neutrophils Relative %: 68 %
Platelets: 177 10*3/uL (ref 150–400)
RBC: 5.55 MIL/uL (ref 4.22–5.81)
RDW: 17.2 % — ABNORMAL HIGH (ref 11.5–15.5)
WBC: 5.6 10*3/uL (ref 4.0–10.5)
nRBC: 0 % (ref 0.0–0.2)

## 2024-01-09 LAB — COMPREHENSIVE METABOLIC PANEL WITH GFR
ALT: 21 U/L (ref 0–44)
AST: 31 U/L (ref 15–41)
Albumin: 3.7 g/dL (ref 3.5–5.0)
Alkaline Phosphatase: 90 U/L (ref 38–126)
Anion gap: 11 (ref 5–15)
BUN: 11 mg/dL (ref 8–23)
CO2: 23 mmol/L (ref 22–32)
Calcium: 8.7 mg/dL — ABNORMAL LOW (ref 8.9–10.3)
Chloride: 101 mmol/L (ref 98–111)
Creatinine, Ser: 0.84 mg/dL (ref 0.61–1.24)
GFR, Estimated: >60 mL/min (ref >60)
Glucose, Bld: 104 mg/dL — ABNORMAL HIGH (ref 70–99)
Potassium: 3.9 mmol/L (ref 3.5–5.1)
Sodium: 135 mmol/L (ref 135–145)
Total Bilirubin: 0.5 mg/dL (ref 0.0–1.2)
Total Protein: 7.4 g/dL (ref 6.5–8.1)

## 2024-01-09 LAB — RESP PANEL BY RT-PCR (RSV, FLU A&B, COVID)  RVPGX2
Influenza A by PCR: NEGATIVE
Influenza B by PCR: NEGATIVE
Resp Syncytial Virus by PCR: NEGATIVE
SARS Coronavirus 2 by RT PCR: NEGATIVE

## 2024-01-09 MED ORDER — ACETAMINOPHEN 325 MG PO TABS
650.0000 mg | ORAL_TABLET | Freq: Once | ORAL | Status: AC | PRN
  Administered 2024-01-09: 650 mg via ORAL
  Filled 2024-01-09: qty 2

## 2024-01-09 MED ORDER — ACETAMINOPHEN 500 MG PO TABS
1000.0000 mg | ORAL_TABLET | Freq: Once | ORAL | Status: DC
  Filled 2024-01-09: qty 2

## 2024-01-09 NOTE — Discharge Instructions (Addendum)
 Would recommend over-the-counter Delsym 12-hour as needed for cough.  Return for any new or worse symptoms.  Respiratory panel is negative for influenza COVID and RSV.  Chest x-ray negative for pneumonia.

## 2024-01-09 NOTE — ED Notes (Signed)

## 2024-01-09 NOTE — ED Provider Notes (Addendum)
 [place] [facility] Provider Note   CSN: [id number] Arrival date & time: 01/09/24  1709     History  Chief Complaint  Patient presents with   flu-like symptoms    Wayne York is a 73 y.o. male.  Patient with exposure to someone with an upper respiratory infection.  He has had symptoms of cough body aches congestion runny nose no nausea no vomiting no diarrhea.  Feels a little short of breath at times mostly with a cough.  Patient was very worried about having COVID or influenza.  Temp here 100.5 pulse was recorded at 34 but on cardiac monitor his heart rate is in the upper 90s.  Respirations 18 blood pressure 159/72 oxygen saturations on room air 94%.  Past medical history significant for hypertension and high cholesterol and he smokes some days.       Home Medications Prior to Admission medications   Medication Sig Start Date End Date Taking? Authorizing Provider  albuterol (VENTOLIN HFA) 108 (90 Base) MCG/ACT inhaler Inhale 2 puffs into the lungs every 4 (four) hours as needed for wheezing. 08/28/20   [provider]  amLODipine (NORVASC) 5 MG tablet Take 1 tablet by mouth daily. 08/28/20   [provider]  cetirizine (ZYRTEC) 10 MG tablet Take 1 tablet by mouth daily. 12/06/19   [provider]  gabapentin (NEURONTIN) 300 MG capsule Take 600 mg by mouth 2 (two) times daily. 12/26/20   [provider]  losartan (COZAAR) 100 MG tablet Take 100 mg by mouth daily. 08/28/20   [provider]  magic mouthwash (lidocaine , diphenhydrAMINE , alum & mag hydroxide) suspension Swish and spit 5 mLs 3 (three) times daily. 05/26/23   Jerrol Agent, MD  metroNIDAZOLE  (FLAGYL ) 500 MG tablet Take 1 tablet (500 mg total) by mouth 3 (three) times daily. 05/01/17   Layden, Lindsey A, PA-C  pravastatin (PRAVACHOL) 40 MG tablet Take 40 mg by mouth daily. 01/01/23   [provider]  traZODone (DESYREL) 50 MG tablet Take  50 mg by mouth at bedtime as needed for sleep. 05/10/21   [provider]      Allergies    Patient has no known allergies.    Review of Systems   Review of Systems  Constitutional:  Positive for fever. Negative for chills.  HENT:  Positive for congestion. Negative for ear pain and sore throat.   Eyes:  Negative for pain and visual disturbance.  Respiratory:  Positive for cough and shortness of breath.   Cardiovascular:  Negative for chest pain and palpitations.  Gastrointestinal:  Negative for abdominal pain, diarrhea, nausea and vomiting.  Genitourinary:  Negative for dysuria and hematuria.  Musculoskeletal:  Positive for myalgias. Negative for arthralgias and back pain.  Skin:  Negative for color change and rash.  Neurological:  Negative for seizures and syncope.  All other systems reviewed and are negative.   Physical Exam Updated Vital Signs BP (!) 159/72 (BP Location: Left Arm)   Pulse (!) 34   Temp (!) 100.5 F (38.1 C)   Resp 17   Ht 1.727 m (5' 8)   Wt 111.1 kg   SpO2 95%   BMI 37.24 kg/m  Physical Exam Vitals and nursing note reviewed.  Constitutional:      General: He is not in acute distress.    Appearance: Normal appearance. He is well-developed.  HENT:     Head: Normocephalic and atraumatic.  Eyes:     Extraocular Movements:  Extraocular movements intact.     Conjunctiva/sclera: Conjunctivae normal.     Pupils: Pupils are equal, round, and reactive to light.  Cardiovascular:     Rate and Rhythm: Normal rate and regular rhythm.     Heart sounds: No murmur heard. Pulmonary:     Effort: Pulmonary effort is normal. No respiratory distress.     Breath sounds: Normal breath sounds. No wheezing.  Abdominal:     Palpations: Abdomen is soft.     Tenderness: There is no abdominal tenderness.  Musculoskeletal:        General: No swelling.     Cervical back: Neck supple.  Skin:    General: Skin is warm and dry.     Capillary Refill: Capillary refill  takes less than 2 seconds.  Neurological:     General: No focal deficit present.     Mental Status: He is alert and oriented to person, place, and time.  Psychiatric:        Mood and Affect: Mood normal.     ED Results / Procedures / Treatments   Labs (all labs ordered are listed, but only abnormal results are displayed) Labs Reviewed  CBC WITH DIFFERENTIAL/PLATELET - Abnormal; Notable for the following components:      Result Value   RDW 17.2 (*)    All other components within normal limits  COMPREHENSIVE METABOLIC PANEL WITH GFR - Abnormal; Notable for the following components:   Glucose, Bld 104 (*)    Calcium 8.7 (*)    All other components within normal limits  RESP PANEL BY RT-PCR (RSV, FLU A&B, COVID)  RVPGX2    EKG EKG Interpretation Date/Time:  Friday January 09 2024 17:36:22 EDT Ventricular Rate:  94 PR Interval:  210 QRS Duration:  171 QT Interval:  403 QTC Calculation: 504 R Axis:   -88  Text Interpretation: Sinus tachycardia Multiple premature complexes, vent & supraven Borderline prolonged PR interval Consider right atrial enlargement RBBB and LAFB New since previous tracing Confirmed by Avangelina Flight [phone]) on 01/09/2024 5:43:58 PM  Radiology No results found.  Procedures Procedures    Medications Ordered in ED Medications  acetaminophen  (TYLENOL ) tablet 1,000 mg (1,000 mg Oral Not Given 01/09/24 1832)  acetaminophen  (TYLENOL ) tablet 650 mg (650 mg Oral Given 01/09/24 1721)    ED Course/ Medical Decision Making/ A&P                                 Medical Decision Making Amount and/or Complexity of Data Reviewed Labs: ordered.  Risk OTC drugs.   Patient nontoxic no acute distress.  CBC very reassuring white count 5.6 hemoglobin 14.5 platelets 177 complete metabolic panel normal including liver function test.  Renal function is normal.  Respiratory panel negative for flu COVID and RSV.  2 view chest is still pending.  I have reviewed that  myself.  There may be something in the left lower lobe area.  Will await their formal read in case this appears to be pneumonia then he will be treated with antibiotics otherwise we will assume this is a viral upper respiratory infection flulike illness and will treat symptomatically.  Patient given Tylenol  for the fever.  Chest x-ray without any evidence of pneumonia.  Will treat symptomatically for flulike illness.  Final Clinical Impression(s) / ED Diagnoses Final diagnoses:  Influenza-like illness    Rx / DC Orders ED Discharge Orders     None  Johntavious Francom, MD 01/09/24 1918    Rayvon Brandvold, MD 01/09/24 2038

## 2024-01-09 NOTE — ED Triage Notes (Addendum)
 Pt reports that he has been feeling sick the last few days. Reports that he has some congestion, runny nose, cough, body aches. Reports some shortness of breath at times. Pt does not appear in distress. Resting pulse noted to be 35

## 2024-02-12 ENCOUNTER — Ambulatory Visit (HOSPITAL_BASED_OUTPATIENT_CLINIC_OR_DEPARTMENT_OTHER): Attending: Family

## 2024-02-12 ENCOUNTER — Other Ambulatory Visit (HOSPITAL_BASED_OUTPATIENT_CLINIC_OR_DEPARTMENT_OTHER): Payer: Self-pay | Admitting: Family

## 2024-02-12 DIAGNOSIS — R109 Unspecified abdominal pain: Secondary | ICD-10-CM

## 2024-08-14 ENCOUNTER — Emergency Department (HOSPITAL_COMMUNITY)
Admission: EM | Admit: 2024-08-14 | Discharge: 2024-08-14 | Disposition: A | Discharge status: Home / Self Care | Attending: Emergency Medicine | Admitting: Emergency Medicine

## 2024-08-14 DIAGNOSIS — K146 Glossodynia: Secondary | ICD-10-CM | POA: Insufficient documentation

## 2024-08-14 DIAGNOSIS — B370 Candidal stomatitis: Secondary | ICD-10-CM

## 2024-08-14 DIAGNOSIS — Z79899 Other long term (current) drug therapy: Secondary | ICD-10-CM | POA: Insufficient documentation

## 2024-08-14 DIAGNOSIS — B379 Candidiasis, unspecified: Secondary | ICD-10-CM | POA: Insufficient documentation

## 2024-08-14 DIAGNOSIS — F17200 Nicotine dependence, unspecified, uncomplicated: Secondary | ICD-10-CM | POA: Insufficient documentation

## 2024-08-14 MED ORDER — CLOTRIMAZOLE 10 MG MT TROC: 10.0000 mg | Freq: Every day | OROMUCOSAL | 0 refills | Status: AC

## 2024-08-14 MED ORDER — MAGIC MOUTHWASH
10.0000 mL | Freq: Once | ORAL | Status: AC
  Administered 2024-08-14: 10 mL via ORAL
  Filled 2024-08-14: qty 10

## 2024-08-14 MED ORDER — LIDOCAINE VISCOUS HCL 2 % MT SOLN: 10.0000 mL | Freq: Four times a day (QID) | OROMUCOSAL | 0 refills | Status: AC | PRN

## 2024-08-14 NOTE — Discharge Instructions (Addendum)
 It was our pleasure to provide your ER care today - we hope that you feel better.  Use clotrimazole  oral troches as directed. You may also try maalox or mylanta as need for symptom relief.   Avoid smoking.   Follow up with primary care doctor in one week.  Return to ER if worse, new symptoms, fevers, unable to swallow, or other concern.

## 2024-08-14 NOTE — ED Provider Notes (Signed)
 [place] EMERGENCY DEPARTMENT AT [facility] Provider Note   CSN: [id number] Arrival date & time: 08/14/24  9060     Patient presents with: No chief complaint on file.   Wayne York is a 73 y.o. male.   Pt c/o tongue/mouth area burning discomfort in the past few days. Denies hx same (but note made of ED visit w similar symptoms 2024).  No sore throat or trouble swallowing. No uri symptoms. No fever or chills. Indicates otherwise does not feel sick or ill. No exposure to hot liquids or food. No new meds. +smoker  The history is provided by the patient and medical records.       Prior to Admission medications   Medication Sig Start Date End Date Taking? Authorizing Provider  magic mouthwash (lidocaine , diphenhydrAMINE , alum & mag hydroxide) suspension Swish and spit 10 mLs 4 (four) times daily as needed for mouth pain. 08/14/24  Yes Vitaly Wanat, MD  albuterol (VENTOLIN HFA) 108 (90 Base) MCG/ACT inhaler Inhale 2 puffs into the lungs every 4 (four) hours as needed for wheezing. 08/28/20   [provider]  amLODipine (NORVASC) 5 MG tablet Take 1 tablet by mouth daily. 08/28/20   [provider]  cetirizine (ZYRTEC) 10 MG tablet Take 1 tablet by mouth daily. 12/06/19   [provider]  gabapentin (NEURONTIN) 300 MG capsule Take 600 mg by mouth 2 (two) times daily. 12/26/20   [provider]  losartan (COZAAR) 100 MG tablet Take 100 mg by mouth daily. 08/28/20   [provider]  metroNIDAZOLE  (FLAGYL ) 500 MG tablet Take 1 tablet (500 mg total) by mouth 3 (three) times daily. 05/01/17   Layden, Lindsey A, PA-C  pravastatin (PRAVACHOL) 40 MG tablet Take 40 mg by mouth daily. 01/01/23   [provider]  traZODone (DESYREL) 50 MG tablet Take 50 mg by mouth at bedtime as needed for sleep. 05/10/21   [provider]    Allergies: Patient has no known allergies.    Review of Systems  Constitutional:  Negative for chills  and fever.  HENT:  Negative for sore throat and trouble swallowing.   Respiratory:  Negative for cough and shortness of breath.   Cardiovascular:  Negative for chest pain.  Gastrointestinal:  Negative for abdominal pain, nausea and vomiting.  Musculoskeletal:  Negative for neck pain.  Skin:  Negative for rash.  Neurological:  Negative for headaches.    Updated Vital Signs BP (!) 153/80 (BP Location: Left Arm)   Pulse 96   Temp 97.9 F (36.6 C) (Oral)   Resp 16   SpO2 98%   Physical Exam Vitals and nursing note reviewed.  Constitutional:      Appearance: Normal appearance. He is well-developed.  HENT:     Head: Atraumatic.     Nose: Nose normal.     Mouth/Throat:     Mouth: Mucous membranes are moist.     Pharynx: Oropharynx is clear. No oropharyngeal exudate or posterior oropharyngeal erythema.     Comments: No mass seen or felt. ?minimal/mild thrush. Dentures. No obvious other oral or throat swelling, mass or infection noted.  Eyes:     General: No scleral icterus.    Conjunctiva/sclera: Conjunctivae normal.  Neck:     Trachea: No tracheal deviation.     Comments: Trachea midline. No neck mass or swelling.  Cardiovascular:     Rate and Rhythm: Normal rate.     Pulses: Normal pulses.  Pulmonary:  Effort: Pulmonary effort is normal. No accessory muscle usage or respiratory distress.  Musculoskeletal:        General: No swelling.     Cervical back: Normal range of motion and neck supple. No rigidity.  Lymphadenopathy:     Cervical: No cervical adenopathy.  Skin:    General: Skin is warm and dry.     Findings: No rash.  Neurological:     Mental Status: He is alert.     Comments: Alert, speech clear.   Psychiatric:        Mood and Affect: Mood normal.     (all labs ordered are listed, but only abnormal results are displayed) Labs Reviewed - No data to display  EKG: None  Radiology: No results found.   Procedures   Medications Ordered in the ED   magic mouthwash (has no administration in time range)                                    Medical Decision Making Problems Addressed: Thrush: acute illness or injury Tongue pain: acute illness or injury  Amount and/or Complexity of Data Reviewed External Data Reviewed: notes.  Risk Prescription drug management.   Reviewed nursing notes and prior charts for additional history.   Magic mouthwash given.   Pt appears stable for ED d/x. Rx provided.   Rec pcp f/u.       Final diagnoses:  Tongue pain  Thrush    ED Discharge Orders          Ordered    magic mouthwash (lidocaine , diphenhydrAMINE , alum & mag hydroxide) suspension  4 times daily PRN        08/14/24 1040               Bernard Drivers, MD 08/14/24 1044

## 2024-08-14 NOTE — ED Triage Notes (Signed)
 Patient complains of tongue pain and burning. States every time he eats or drinks anything that his tongue burns. Started 2 days ago. Feels like the roof of his mouth is numb. No sores or red areas to tongue. Rates pain 5/10.
